# Patient Record
Sex: Female | Born: 1935 | Race: Black or African American | Hispanic: No | Smoking: Former smoker
Health system: Southern US, Community
[De-identification: ages and names within clinical notes are randomized; demographics above are authoritative.]

## PROBLEM LIST (undated history)

## (undated) DIAGNOSIS — I10 Essential (primary) hypertension: Secondary | ICD-10-CM

## (undated) DIAGNOSIS — R42 Dizziness and giddiness: Secondary | ICD-10-CM

## (undated) DIAGNOSIS — R011 Cardiac murmur, unspecified: Secondary | ICD-10-CM

## (undated) DIAGNOSIS — F0390 Unspecified dementia without behavioral disturbance: Secondary | ICD-10-CM

## (undated) DIAGNOSIS — W19XXXA Unspecified fall, initial encounter: Secondary | ICD-10-CM

## (undated) HISTORY — PX: LEG SURGERY: SHX1003

## (undated) HISTORY — PX: ABDOMINAL HYSTERECTOMY: SHX81

---

## 1999-07-02 ENCOUNTER — Emergency Department (HOSPITAL_COMMUNITY): Admission: EM | Admit: 1999-07-02 | Discharge: 1999-07-02 | Payer: Self-pay | Admitting: Emergency Medicine

## 2000-01-29 ENCOUNTER — Emergency Department (HOSPITAL_COMMUNITY): Admission: EM | Admit: 2000-01-29 | Discharge: 2000-01-29 | Payer: Self-pay | Admitting: Emergency Medicine

## 2002-05-19 ENCOUNTER — Encounter: Payer: Self-pay | Admitting: Emergency Medicine

## 2002-05-19 ENCOUNTER — Emergency Department (HOSPITAL_COMMUNITY): Admission: EM | Admit: 2002-05-19 | Discharge: 2002-05-19 | Payer: Self-pay | Admitting: Emergency Medicine

## 2003-08-12 ENCOUNTER — Encounter: Payer: Self-pay | Admitting: Nephrology

## 2003-08-12 ENCOUNTER — Inpatient Hospital Stay (HOSPITAL_COMMUNITY): Admission: EM | Admit: 2003-08-12 | Discharge: 2003-08-17 | Payer: Self-pay | Admitting: Emergency Medicine

## 2003-08-14 ENCOUNTER — Encounter (INDEPENDENT_AMBULATORY_CARE_PROVIDER_SITE_OTHER): Payer: Self-pay | Admitting: Specialist

## 2003-08-15 ENCOUNTER — Encounter (INDEPENDENT_AMBULATORY_CARE_PROVIDER_SITE_OTHER): Payer: Self-pay | Admitting: Cardiology

## 2003-08-16 ENCOUNTER — Encounter: Payer: Self-pay | Admitting: Internal Medicine

## 2004-07-04 ENCOUNTER — Inpatient Hospital Stay (HOSPITAL_COMMUNITY): Admission: EM | Admit: 2004-07-04 | Discharge: 2004-07-11 | Payer: Self-pay

## 2004-07-27 ENCOUNTER — Ambulatory Visit (HOSPITAL_COMMUNITY): Admission: RE | Admit: 2004-07-27 | Discharge: 2004-07-27 | Payer: Self-pay | Admitting: Internal Medicine

## 2004-09-06 ENCOUNTER — Emergency Department (HOSPITAL_COMMUNITY): Admission: EM | Admit: 2004-09-06 | Discharge: 2004-09-06 | Payer: Self-pay | Admitting: Emergency Medicine

## 2004-10-02 ENCOUNTER — Emergency Department (HOSPITAL_COMMUNITY): Admission: EM | Admit: 2004-10-02 | Discharge: 2004-10-02 | Payer: Self-pay | Admitting: Emergency Medicine

## 2004-11-06 ENCOUNTER — Ambulatory Visit (HOSPITAL_COMMUNITY): Admission: RE | Admit: 2004-11-06 | Discharge: 2004-11-06 | Payer: Self-pay | Admitting: Internal Medicine

## 2005-04-03 ENCOUNTER — Inpatient Hospital Stay (HOSPITAL_COMMUNITY): Admission: EM | Admit: 2005-04-03 | Discharge: 2005-04-06 | Payer: Self-pay | Admitting: Emergency Medicine

## 2005-04-18 ENCOUNTER — Ambulatory Visit: Payer: Self-pay | Admitting: Internal Medicine

## 2005-05-05 ENCOUNTER — Ambulatory Visit: Payer: Self-pay | Admitting: Internal Medicine

## 2005-05-26 ENCOUNTER — Ambulatory Visit: Payer: Self-pay | Admitting: Internal Medicine

## 2005-06-06 ENCOUNTER — Emergency Department (HOSPITAL_COMMUNITY): Admission: EM | Admit: 2005-06-06 | Discharge: 2005-06-06 | Payer: Self-pay | Admitting: Emergency Medicine

## 2005-06-09 ENCOUNTER — Emergency Department (HOSPITAL_COMMUNITY): Admission: EM | Admit: 2005-06-09 | Discharge: 2005-06-10 | Payer: Self-pay | Admitting: Emergency Medicine

## 2005-06-11 ENCOUNTER — Ambulatory Visit: Payer: Self-pay | Admitting: Internal Medicine

## 2005-07-03 ENCOUNTER — Ambulatory Visit: Payer: Self-pay | Admitting: Internal Medicine

## 2005-07-18 ENCOUNTER — Ambulatory Visit: Payer: Self-pay | Admitting: Internal Medicine

## 2005-09-02 ENCOUNTER — Ambulatory Visit: Payer: Self-pay | Admitting: Internal Medicine

## 2005-09-22 ENCOUNTER — Ambulatory Visit: Payer: Self-pay | Admitting: Internal Medicine

## 2005-10-09 ENCOUNTER — Ambulatory Visit: Payer: Self-pay | Admitting: Internal Medicine

## 2005-10-24 ENCOUNTER — Ambulatory Visit: Payer: Self-pay | Admitting: Internal Medicine

## 2005-11-05 ENCOUNTER — Emergency Department (HOSPITAL_COMMUNITY): Admission: EM | Admit: 2005-11-05 | Discharge: 2005-11-05 | Payer: Self-pay | Admitting: Emergency Medicine

## 2005-11-25 ENCOUNTER — Ambulatory Visit: Payer: Self-pay | Admitting: Internal Medicine

## 2006-03-10 ENCOUNTER — Ambulatory Visit (HOSPITAL_COMMUNITY): Admission: RE | Admit: 2006-03-10 | Discharge: 2006-03-10 | Payer: Self-pay | Admitting: Internal Medicine

## 2006-04-25 ENCOUNTER — Inpatient Hospital Stay (HOSPITAL_COMMUNITY): Admission: EM | Admit: 2006-04-25 | Discharge: 2006-04-28 | Payer: Self-pay | Admitting: Emergency Medicine

## 2006-04-27 ENCOUNTER — Encounter (INDEPENDENT_AMBULATORY_CARE_PROVIDER_SITE_OTHER): Payer: Self-pay | Admitting: Interventional Cardiology

## 2009-01-16 ENCOUNTER — Other Ambulatory Visit: Admission: RE | Admit: 2009-01-16 | Discharge: 2009-01-16 | Payer: Self-pay | Admitting: Internal Medicine

## 2009-09-11 ENCOUNTER — Emergency Department (HOSPITAL_COMMUNITY): Admission: EM | Admit: 2009-09-11 | Discharge: 2009-09-11 | Payer: Self-pay | Admitting: Emergency Medicine

## 2011-02-27 LAB — DIFFERENTIAL
Eosinophils Absolute: 0.3 10*3/uL (ref 0.0–0.7)
Monocytes Absolute: 0.6 10*3/uL (ref 0.1–1.0)

## 2011-02-27 LAB — POCT I-STAT, CHEM 8
Creatinine, Ser: 1.4 mg/dL — ABNORMAL HIGH (ref 0.4–1.2)
HCT: 40 % (ref 36.0–46.0)
TCO2: 23 mmol/L (ref 0–100)

## 2011-02-27 LAB — POCT CARDIAC MARKERS
Myoglobin, poc: 165 ng/mL (ref 12–200)
Troponin i, poc: <0.05 ng/mL (ref 0.00–0.09)

## 2011-02-27 LAB — URINALYSIS, ROUTINE W REFLEX MICROSCOPIC
Glucose, UA: NEGATIVE mg/dL
Hgb urine dipstick: NEGATIVE
Ketones, ur: NEGATIVE mg/dL
Leukocytes, UA: NEGATIVE
Nitrite: NEGATIVE
Protein, ur: 100 mg/dL — AB
pH: 7 (ref 5.0–8.0)

## 2011-02-27 LAB — URINE MICROSCOPIC-ADD ON

## 2011-02-27 LAB — CBC
HCT: 38.7 % (ref 36.0–46.0)
Hemoglobin: 13 g/dL (ref 12.0–15.0)
MCHC: 33.7 g/dL (ref 30.0–36.0)
MCV: 83.5 fL (ref 78.0–100.0)

## 2011-04-11 NOTE — Op Note (Signed)
[name], [name]                          ACCOUNT NO.:  [id number]   MEDICAL RECORD NO.:  [id number]                   PATIENT TYPE:  INP   LOCATION:  5148                                 FACILITY:  [facility]   PHYSICIAN:  Anselmo Rod, M.D.               DATE OF BIRTH:  [birth year]   DATE OF PROCEDURE:  08/12/2003  DATE OF DISCHARGE:                                 OPERATIVE REPORT   PROCEDURE PERFORMED:  Colonoscopy.   ENDOSCOPIST:  Charna Elizabeth, M.D.   INSTRUMENT USED:  Olympus video colonoscope.   INDICATIONS FOR PROCEDURE:  Rectal bleeding in an 77 year old African-  American female.  Rule out colonic polyps, masses, etc.   PREPROCEDURE PREPARATION:  Informed consent was procured from the patient.  The patient was fasted for eight hours prior to the procedure and prepped  with a bottle of magnesium citrate and a gallon of GoLYTELY the night prior  to the procedure.   PREPROCEDURE PHYSICAL:  The patient had stable vital signs.  Neck supple.  Chest clear to auscultation.  S1 and S2 regular.  Abdomen soft with normal  bowel sounds.   DESCRIPTION OF PROCEDURE:  The patient was placed in left lateral decubitus  position and sedated with 70 mcg of fentanyl and 6 mg of Versed  intravenously.  Once the patient was adequately sedated and maintained on  low flow oxygen and continuous cardiac monitoring, the Olympus video  colonoscope was advanced from the rectum to the cecum with difficulty.  There was evidence of pandiverticulosis with more prominent changes in the  left colon.  Fresh clots were seen in a couple of the diverticula which may  indicate the source of the patient's bleeding.  A small mucosal lipoma was  seen in the right colon.  This was not biopsied.  No attempts were made to  remove it.  Lipomatous ileocecal valve was also identified.  The  appendicular orifice and ileocecal valve were visualized and photographed.  No masses or polyps were seen.  The patient  tolerated the procedure well  without complications.   IMPRESSION:  1. Pandiverticulosis with more prominent changes in the sigmoid colon.  2. A small submucosal lipoma in the right colon.  3. Lipomatous ileocecal valve.  4. Blood clots in a couple of the diverticula, question diverticular bleed   RECOMMENDATIONS:  1. Avoid nuts, seeds and popcorn.  2. Continue serial CBCs.  3. Outpatient follow-up in the next two weeks or earlier if need be.  4. Iron supplementation with close monitoring of hemoglobin level.                                                   Anselmo Rod, M.D.    JNM/MEDQ  D:  08/16/2003  T:  08/17/2003  Job:  [id number]   cc:   Margaretmary Bayley, M.D.  [address], [address]  [place]  [place] [address]  Fax: [phone]   Cristy Hilts. Jacinto Halim, M.D.  [address]  [place]  [place] [address]  Fax: [phone]
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
## 2011-04-11 NOTE — Consult Note (Signed)
NAMEBENTLI, LLORENTE                          ACCOUNT NO.:  [id number]   MEDICAL RECORD NO.:  [id number]                   PATIENT TYPE:  INP   LOCATION:  5148                                 FACILITY:  [facility]   PHYSICIAN:  Anselmo Rod, M.D.               DATE OF BIRTH:  [birth year]   DATE OF CONSULTATION:  08/13/2003  DATE OF DISCHARGE:                                   CONSULTATION   REASON FOR CONSULTATION:  Rectal bleeding.   ASSESSMENT:  1. Hematochezia for the last 24 hours.  Rule out colonic polyps, masses,     diverticulosis as upper gastrointestinal source of bleeding, peptic ulcer     disease, arteriovenous malformations, etc.  2. History of a hiatal hernia/gastroesophageal reflux disease on Protonix.  3. Chronic constipation on stool softeners p.r.n.  4. Adult-onset diabetes mellitus on Amaryl.  Hemoglobin A1C during this     hospitalization is 8.4.  5. Arthritis on Mobic.  Has been on Vioxx and Celebrex in the past.  6. Recently diagnosed sciatica on prednisone taper which the patient did not     take.  7. Status post two NVD's in the remote past.  8. History of abdominal hysterectomy 1960.  9. Urinary frequency, question stress incontinence on Ditropan in the past.  10.      Shortness of breath.  Rule out chronic congestive heart failure.   RECOMMENDATIONS:  1. Agree with PPI's.  2. Discontinue all nonsteroidals including aspirin and Mobic.  3. Prep with Go-LYTE LY for EGD tomorrow.  4. Continue serial CBCs.  5. Transfuse as needed to keep hematocrit about 25-28%.  6. Check a TSH with next blood draw.  7. 2-D echocardiogram in a.m. (discuss with Vonna Kotyk R. Jacinto Halim, M.D.   DISCUSSION:  Ms. [name] is a pleasant 77 year old African-American  female admitted to [facility] on August 12, 2003 for further  evaluation of hematochezia.  The patient describes three bloody bowel  movements at home after 2 p.m.  Initially, she felt dizzy, tired, and  fatigued and about 2:00 started passing bright red blood per rectum.  This  was associated with some weakness but the patient denies syncope or near  syncopal episodes.  She gives a similar history three weeks ago, but claims  no work-up was undertaken.  There is no history of nausea, vomiting,  hematemesis, or coffee ground emesis associated with her symptoms.  She  denies any history of melena.  She has had some lower abdominal cramping but  there is no history of fever, chills, or rigors.  There is no previous  history of ulcer, jaundice, or colitis.  She has had back pain radiating  down to her thighs and legs and has been diagnosed with sciatica in the  past.  She was put on a prednisone taper which she did not take.  She  describes progressive shortness of breath for  the last one year, but has not  had this worked up (according to the patient).  There are no known  cardiorespiratory problems.  She denies chest pain or diaphoresis.  There is  history of abnormal weight loss or weight gain.  She has a history of  chronic constipation and takes stool softeners as needed.  She denies having  undergone a colonoscopy in the past.  She has a history of headaches, but  denies any ENT or renal complaints.   ALLERGIES:  NO KNOWN DRUG ALLERGIES.   MEDICATIONS:  1. Amaryl.  2. Mobic.  3. Protonix.  4. Aspirin 81 mg daily.  5. Prednisone taper (patient did not take this).   PAST MEDICAL HISTORY:  See list above.   SOCIAL HISTORY:  She is a widow.  Her husband died in 31 of an MI.  She  has two grown children who reside in [place], [place] and [place],  respectively.  She has four grandchildren, several great grandchildren.  She  used to smoke a half pack per day from the age of 62 to the age of 67, but  has not smoked since then.  She used to drink socially in her younger years,  but has not had any alcohol since the age of 35.  She denies use of illicit  street drugs.   FAMILY  HISTORY:  The patient could not tell me any of the details on her  family.   PHYSICAL EXAMINATION:  GENERAL:  Very pleasant, articulate, elderly African-  American female in no acute distress.  VITAL SIGNS:  Blood pressure 147/68, pulse 70 per minute, respiratory rate  20, temperature 97.5.  HEENT:  [place] AT.  PERRLA.  The patient has pink and moist oropharyngeal mucosa  without exudate.  Dentures in the upper jaw and partials in the lower jaw.  NECK:  Supple.  No JVD, thyromegaly, lymphadenopathy.  CHEST:  Clear to auscultation.  HEART:  S1, S2 regular with decreased breath sounds at the bases.  ABDOMEN:  Soft, obese with large surgical scar present in the midline below  the umbilicus from previous abdominal hysterectomy.  There is some left  lower quadrant, right lower quadrant tenderness on palpation with guarding.  No rebound.  No rigidity.  No hepatosplenomegaly.  No masses palpable.  RECTAL:  Digital rectal examination done by Dr. Sherrie Mustache revealed bright red  blood on examining finger with no masses palpable.  Rectal examination was  not repeated by me today.   LABORATORIES:  Hemoglobin 11.2, white count 13.8, platelets 308,000.  PT  13.6, PTT 35, INR 1.1.  Repeat hemoglobin today was 11.3, white count 12.9,  platelets 267,000.   Chest x-ray showed decreased lung volumes with cardiomegaly.   EKG showed normal sinus rhythm with nonspecific T-wave abnormality.   PLAN:  As outlined above.  Further recommendations made in follow-up.                                               Anselmo Rod, M.D.    JNM/MEDQ  D:  08/13/2003  T:  08/14/2003  Job:  [id number]   cc:   Jarome Matin, M.D.  [address] [place]  [place] [address]  Fax: [phone]   Cristy Hilts. Jacinto Halim, M.D.  [address]  [place]  [place] [address]  Fax:  [phone]   Margaretmary Bayley, M.D.  [address]  [place]  [place] [address] Fax: [phone]
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
## 2011-04-11 NOTE — Op Note (Signed)
   NAMEPEIGHTON, [name]                          ACCOUNT NO.:  [id number]   MEDICAL RECORD NO.:  [id number]                   PATIENT TYPE:  INP   LOCATION:  5148                                 FACILITY:  [facility]   PHYSICIAN:  Anselmo Rod, M.D.               DATE OF BIRTH:  04-Sep-1917   DATE OF PROCEDURE:  08/14/2003  DATE OF DISCHARGE:                                 OPERATIVE REPORT   PROCEDURE PERFORMED:  Colonoscopy changed to flexible sigmoidoscopy.   ENDOSCOPIST:  Charna Elizabeth, M.D.   INSTRUMENT USED:  Olympus video colonoscope.   INDICATIONS FOR PROCEDURE:  The patient is an 77 year old African-American  female with a history of rectal bleeding and melenic stool with anemia.  Rule out colonic polyps, masses, etc.   PREPROCEDURE PREPARATION:  Informed consent was procured from the patient.  The patient was fasted for eight hours prior to the procedure and prepped  with a gallon of GoLYTELY.  The patient, however, did not consume the whole  gallon of GoLYTELY prior to the procedure.   PREPROCEDURE PHYSICAL:  The patient had stable vital signs.  Neck supple.  Chest clear to auscultation.  S1 and S2 regular.  Abdomen soft with normal  bowel sounds.   DESCRIPTION OF PROCEDURE:  The patient was placed in left lateral decubitus  position and sedated with Demerol and Versed for the EGD.  Once the patient  was adequately sedated and maintained on low flow oxygen and continuous  cardiac monitoring, the Olympus video colonoscope was advanced from the  rectum to about 10 cm.  There was dark melenic stool present in the rectum  up to 10 cm and beyond that there was brown stool.  The scope could not be  advanced beyond this point and the procedure was aborted with plans to  reprep the patient today and do a colonoscopy tomorrow.   IMPRESSION:  Inadequate prep.  Colonoscopy planned in the a.m. after  reprepping the patient tonight.               Anselmo Rod, M.D.    JNM/MEDQ  D:  08/14/2003  T:  08/14/2003  Job:  [id number]   cc:   Margaretmary Bayley, M.D.  [address]  [place]  [place] [address]  Fax: [phone]   Cristy Hilts. Jacinto Halim, M.D.  [address]  [place]  [place] [address]  Fax: [phone]

## 2011-04-11 NOTE — Discharge Summary (Signed)
[name], [name] NO.:  [id number]   MEDICAL RECORD NO.:  [id number]                   PATIENT TYPE:  INP   LOCATION:  5148                                 FACILITY[facility]   PHYSICIAN:  Margaretmary Bayley, M.D.                 DATE OF BIRTH:  [birth year]   DATE OF ADMISSION:  08/12/2003  DATE OF DISCHARGE:  08/17/2003                                 DISCHARGE SUMMARY   DISCHARGE DIAGNOSES:  1. Diverticulosis of the colon with hemorrhage.  2. Insulin-independent diabetes mellitus.  3. Osteoarthritis.  4. Left lumbar radiculopathy with evidence of lumbar disk displacement.  5. Helicobacter pylori gastroesophagitis.  6. Systemic hypertension.   REASON FOR ADMISSION:  This is one of several [place] hospitalizations  for [name], an 77 year old hypertensive type 2 diabetic who is brought  into the emergency department with a chief complaint of rectal bleeding for  several hours.  The patient denies any associated abdominal pain.  She did  feel lightheaded and dizziness on standing but denies any syncopal episodes.  She denies any previous history of rectal bleeding but has noted in the  past, symptoms of reflux gastroesophagitis and peptic ulcer disease.   PERTINENT PHYSICAL FINDINGS:  VITAL SIGNS:  On admission, she had a blood  pressure of 134/68 in the sitting position with the legs dangling and a  pulse rate of 84, respiratory rate of 20 and temperature was 97.  HEENT:  There is no conjunctival pallor, no scleral icterus.  She had an NG  tube in place.  NECK:  Her neck is supple.  No jugular venous distention, no carotid bruits  and no thyromegaly.  CHEST:  No splinting or tenderness.  Lungs are clear to percussion and  auscultation.  CARDIAC:  Her precordium is 2+ hyperdynamic.  S1 and S2 are normal.  No  discernable murmurs, rubs, or gallops.  ABDOMEN:  Her abdomen is obese with slight epigastric and periumbilical  tenderness, no rebound  tenderness.  She had active bowel sounds.  No  palpable organomegaly and no masses are palpated.  RECTAL:  She had good sphincter tone.  Stool was positive for blood  grossly, being dark maroonish in color.  There were no palpable masses or  tenderness appreciated.   ADMISSION LABORATORY DATA:  White count of 13,800, hematocrit of 33,  hemoglobin of 11.2.  Her INR and PTT were normal.  Her CMET was normal  except for a glucose of 116 and albumin of 3.3.  A hemoglobin A1c is 8.4%.   Chest x-ray revealed bilateral cervical spurring.  She had mild cardiomegaly  and increase in interstitial markings at the bases.  Her abdominal film  revealed no acute changes.  She had a normal gas pattern.   HOSPITAL COURSE:  The patient was admitted and initially underwent an upper  endoscopy because of her upper intestinal tenderness and symptomatology.  Upper endoscopy revealed diffuse gastritis which subsequently did return  positive for H. pylori.  She was started on therapy with Prevpac, a  combination of Prevacid along with dual-antibiotic therapy for H. pylori.  A  subsequent colonoscopy performed two days after endoscopy revealed fairly  significant diverticular changes in the colon, however, no mass lesions or  polyps were seen.  The patient's diet was gradually advanced over the next  24 hours.  Her diabetes was controlled with a combination of oral  hyperglycemic agents and a fractional schedule of Regular short-acting  insulin for her preprandial blood sugars.   During her hospitalization, the patient continued to complain of lower back  pain radiating into the left hip area and down the leg.  An MRI performed on  this admission did reveal an L5 disk herniation with compression.  She was  scheduled to be started on epidural steroid injections as an outpatient.  The patient was subsequently discharged in a condition that was  significantly improved.   DIET:  Diet is one of a 3-g sodium,  1500-calorie, carb-modified, antireflux-  exclusion diet.   FOLLOWUP:  She is scheduled to be seen back in the office in two weeks, at  which time she will be formally referred for initiation of her epidural  steroids.                                                Margaretmary Bayley, M.D.    PC/MEDQ  D:  09/28/2003  T:  09/29/2003  Job:  [id number]

## 2011-04-11 NOTE — Discharge Summary (Signed)
[name], [name] NO.:  [id number]   MEDICAL RECORD NO.:  [id number]          PATIENT TYPE:  INP   LOCATION:  3707                         FACILITY:  [facility]   PHYSICIAN:  Margaretmary Bayley, M.D.    DATE OF BIRTH:  [birth year]   DATE OF ADMISSION:  04/25/2006  DATE OF DISCHARGE:  04/28/2006                                 DISCHARGE SUMMARY   DATE OF ADMISSION:  April 25, 2006   DATE OF DISCHARGE:  April 28, 2006   DISCHARGE DIAGNOSES:  1.  Chest pain with pulmonary embolization and acute myocardial infarction      being excluded.  2.  Chronic obstructive and interstitial lung disease.  3.  History of chronic pyelonephritis and cystitis.  4.  Type 2 non-insulin dependent diabetes mellitus.  5.  Systemic hypertension.   REASON FOR ADMISSION:  This is one of several [place] hospitalizations  for [name], an 77 year old hypertensive, type 2 diabetic woman, who is  brought into the emergency room with a chief complaint of anterior chest  pain, some mild shortness of breath, but no nausea, vomiting, or  diaphoresis.   PERTINENT PHYSICAL FINDINGS:  GENERAL:  On exam she is an elderly woman  looking younger than her stated age of 22.  She has no respiratory distress.  Skin is cool and dry.  VITAL SIGNS:  Temperature 98.5, pulse rate of 66, blood pressure 128/55, and  a respiratory rate of 20.  HEENT:  She has some mild erythema of the right, greater than left, eye  secondary to recent cataract surgery.  Her precordium was adynamic.  No  visible pulsations.  No heaves or thrills.  LUNGS:  Clear to percussion and auscultation.  No wheezes are noted.  CHEST:  She had some mild tenderness of the mid chest wall and epigastrium,  but no rebound or other signs of peritonitis.  EXTREMITIES:  No clubbing, cyanosis, or edema.  Negative Homans sign.  NEUROLOGICAL:  She is alert, oriented, and cooperative.   PERTINENT LAB AND X-RAY DATA:  Admission white count is 13,900,  hematocrit  of 37.  Her pH is 97.45 with a pCO2 of 36.  Her hematocrit is 37 with a  hemoglobin of 12.4.  Nonfasting glucose of 121.  D-dimer of 1.0.  Her PT and  INR are normal, as well as a PTT of 25.  The remainder of her CMED is  perfectly normal.  Her CK level is normal at 127 with an MB of 3.2 and a  relative index of 2.5, troponin of 0.02, all of which are normal.  TSH of  2.8 and T3 of 104.  These are likewise normal.  The admission EKG revealed a  normal sinus rhythm with a first degree AV block, nonspecific repolarization  abnormalities diffusely, but she had no hyperacute T wave abnormalities and  no deeply inverted T waves.  Subsequent EKGs on her second and third  hospital days, likewise, showed no evolutionary changes and no other acute  changes.  A CT angiography of the chest showed no evidence of pulmonary  emboli.  She had some cardiomegaly with chronic bronchitic changes and  chronic interstitial lung disease.  She had a small sliding hiatal hernia.   HOSPITAL COURSE:  The patient was admitted and treated with a working  diagnosis of an acute myocardial infarction receiving telemetry monitoring,  prophylactic Lovenox therapy, and symptomatic treatment of her chest pain,  which lasted only several hours into her hospitalization.  As noted above,  the patient's parameters, which included her CK levels, troponin, serial  EKGs, were not consistent with acute cardiopulmonary event.  She did have a  sliding hiatal hernia and in the past has had clinical symptomatologies  consistent with gastroesophageal reflux disease.  During the  hospitalization, several other tests were requested, namely a BNP, but the  patient became very negative and refused to cooperate for any other studies.  On the fourth hospital day, she refused to wait for my evaluation and to  discuss our findings and subsequent disposition.  She signed out AMA, but  was in a stable condition and was asymptomatic at  the time of her release.   The patient will be contacted the day after leaving the hospital.  She is  instructed to resume her outpatient medications, which included Diovan 160 a  day, Toprol-XL 25 mg per day, Ecotrin 81 mg daily, Lasix 20 mg per day,  GlycoLax, and her ophthalmologic eye drops.  Diet is a 3 g sodium modified  fat-restricted, no concentrated sweets diet.           ______________________________  Margaretmary Bayley, M.D.     PC/MEDQ  D:  06/04/2006  T:  06/05/2006  Job:  [id number]

## 2011-04-11 NOTE — Op Note (Signed)
[name], [name]                          ACCOUNT NO.:  [id number]   MEDICAL RECORD NO.:  [id number]                   PATIENT TYPE:  INP   LOCATION:  5148                                 FACILITY:  [facility]   PHYSICIAN:  Anselmo Rod, M.D.               DATE OF BIRTH:  08/18/1917   DATE OF PROCEDURE:  08/14/2003  DATE OF DISCHARGE:                                 OPERATIVE REPORT   PROCEDURE PERFORMED:  Esophagogastroduodenoscopy with biopsies x3.   ENDOSCOPIST:  Anselmo Rod, M.D.   INSTRUMENT USED:  Olympus video panendoscope.   INDICATION FOR PROCEDURE:  An 77 year old African-American female with a  history of anemia and rectal bleeding followed by dark, tarry stools.  Rule  out peptic ulcer disease, esophagitis, gastritis, etc.   PREPROCEDURE PREPARATION:  Informed consent was procured from the patient.  The patient was fasted for eight hours prior to the procedure.   PREPROCEDURE PHYSICAL:  VITAL SIGNS:  The patient had stable vital signs.  NECK:  Supple.  CHEST:  Clear to auscultation.  S1, S2 regular.  ABDOMEN:  Soft with normal bowel sounds.   DESCRIPTION OF PROCEDURE:  The patient was placed in the left lateral  decubitus position and sedated with 50 mg of Demerol and 5 mg of Versed  intravenously.  Once the patient was adequately sedate and maintained on low-  flow oxygen and continuous cardiac monitoring, the Olympus video  panendoscope was advanced through the mouthpiece, over the tongue, into the  esophagus under direct vision.  The entire esophagus appeared normal with no  evidence of ring, stricture, masses, esophagitis, or Barrett's mucosa.  The  scope was then advanced to the stomach.  There was a small hiatal hernia  seen on retroflexion.  There was diffuse gastritis noted throughout the  gastric mucosa.  The proximal small bowel appeared normal.   IMPRESSION:  1. Normal-appearing esophagus and proximal small bowel.  2. Severe diffuse gastritis,  biopsy done for Helicobacter pylori by     pathology.  3. Small hiatal hernia seen on retroflexion in the high cardia.   RECOMMENDATIONS:  1. Change Protonix to p.o.  2.     Proceed with a colonoscopy at this time.  3. Discontinue all nonsteroidals, including aspirin.  4. Treat with antibiotics if H. pylori present by pathology.                                               Anselmo Rod, M.D.    JNM/MEDQ  D:  08/14/2003  T:  08/14/2003  Job:  [id number]   cc:   Margaretmary Bayley, M.D.  [address]  [place]  [place] [address]  Fax: [phone]   Cristy Hilts. Jacinto Halim, M.D.  [address] [place]  [place]  [place] [address]  Fax: [phone]

## 2011-04-11 NOTE — Op Note (Signed)
[name], [name]                          ACCOUNT NO.:  [id number]   MEDICAL RECORD NO.:  [id number]                   PATIENT TYPE:  INP   LOCATION:  2107                                 FACILITY:  [facility]   PHYSICIAN:  Mark C. Ophelia Charter, M.D.                 DATE OF BIRTH:  [birth year]   DATE OF PROCEDURE:  DATE OF DISCHARGE:                                 OPERATIVE REPORT   PREOPERATIVE DIAGNOSIS:  Right intertrochanteric hip fracture.   POSTOPERATIVE DIAGNOSIS:  Right intertrochanteric hip fracture.   PROCEDURE:  Right hip compression screw, Synthes 85-mm lag, 135 4-hole side  plate.   SURGEON:  Mark C. Ophelia Charter, MD.   ANESTHESIA:  GOT.   ESTIMATED BLOOD LOSS:  200 mL.   OPERATIVE PROCEDURE:  After induction of general anesthesia and prep with  Ancef prophylaxis, patient placed on the fracture table, a well-leg holder  for the left leg.  Traction was applied, internal rotation for  lateralization of the trochanter, and reduction was performed and checked  under fluoroscopy, with anatomic position.  Standard prepping and draping, a  large shower curtain binder was brought after area was squared with towels.  An incision was made starting at the trochanter and extending laterally in  line with the femur.  The subcutaneous tissue, which is about 10 to 14 cm of  adipose tissue, was divided until the extensor fascia was identified.  It  was nicked, split in line with the fibers using scissors.  The vastus  lateralis both anteriorly, subperiosteal dissection of the lateral cortex of  the femur.  __________ were used to drill holes starting in the lateral  cortex of the femur, checked under fluoro, then was tapped up center-center,  drilled by hand, tapped, and then measurement made, and an 85 screw placed.  Portal side plate was then applied at 135 degrees, holes were drilled,  screws were inserted, varying between 34 and 38 mm.  The compression screw  was inserted, then removed  after being tightened down.  The traction was  released as this was tightened.  There was excellent position.  Irrigation  with saline solution.  The extensor fascia was closed with 0 Vicryl, 2-0  Vicryl was used in the subcutaneous tissue, skin staple closure, Marcaine  infiltration in the skin, postop dressing.  The patient was transferred to a  regular table and then to the recovery room.                                               Mark C. Ophelia Charter, M.D.    MCY/MEDQ  D:  07/05/2004  T:  07/07/2004  Job:  [id number]

## 2011-04-11 NOTE — Discharge Summary (Signed)
[name], [name]              ACCOUNT NO.:  [id number]   MEDICAL RECORD NO.:  [id number]          PATIENT TYPE:  INP   LOCATION:  0445                         FACILITY:  [facility]   PHYSICIAN:  Margaretmary Bayley, M.D.    DATE OF BIRTH:  Apr 20, 1917   DATE OF ADMISSION:  04/03/2005  DATE OF DISCHARGE:  04/06/2005                                 DISCHARGE SUMMARY   DISCHARGE DIAGNOSES:  1.  Acute pyelonephritis with Escherichia coli being grown from the urine.  2.  Systemic hypertension.  3.  History of type 2 diabetes mellitus.   REASON FOR ADMISSION:  [name] is an 77 year old hypertensive, diabetic  who was admitted with nausea, vomiting, and lower and periumbilical  abdominal pain.  The patient states that the pain occurred after she had  taken some Darvocet and she was of the opinion that it was probably the  medicine that caused her to feel nauseated but could not explain the  abdominal pain.  In any event, she came to the emergency room where she was  noted to have an elevated white blood count and was admitted for further  evaluation.   PERTINENT PHYSICAL FINDINGS:  GENERAL:  She is a well-developed, well-  nourished, African-American woman appearing younger than her stated age of  26.  VITAL SIGNS:  Blood pressure is 172/92, pulse rate of 96, respiratory rate  of 18-20 and her temperature was 99.  HEENT:  Her pharynx was without any injection or erythema.  NECK:  Is supple.  No adenopathy or thyromegaly.  CHEST:  No splinting, tenderness.  Her lungs were clear.  CARDIAC:  She had a regular rhythm.  Normal S1 and S2.  ABDOMEN:  She had some minimal suprapubic and periumbilical tenderness.  There was no rebound.  Bowel sounds were normal.  RECTAL:  The stool was negative for occult blood.   PERTINENT LABORATORY AND X-RAY DATA:  She had an elevated sedimentation rate  at 40.  Abnormalities in her metabolic panel.  There was an albumin that was  low at 2.9 and a random  glucose was elevated at 110.  A CPK, MB, troponin  were normal.  A urinalysis revealed many bacteria and 3-6 white cells per  high power field.  A three-way abdomen was remarkable for some cardiomegaly  but no acute infiltrates.  She had no air fluid levels and no evidence of  intestinal obstruction.  Her white blood count was elevated at 15,000.   HOSPITAL COURSE:  The patient was admitted with abdominal pain,  leukocytosis, and bacteruria consistent with pyelonephritis and not cystitis  although she did have extension of her infection into the lower urinary  tract.  She was hypertensive with a systolic blood pressure of 172.  After  blood and urine cultures were obtained, the patient was empirically started  on Unasyn at 1.5 g IV every eight hours.  A urine, renal, and abdominal  ultrasound was obtained and it was negative for any obstruction in the  urinary tract and no gallstones.  On the following morning, the patient's  white count was down  to 11,000.  At no point during her hospitalization did  she have a fever.  Her urine did grow out E coli sensitive to ampicillin.  With the patient's fairly unremarkable hospital course, she was subsequently  discharged home on Augmentin 875 mg twice a day, Ecotrin 81 mg daily, and  for her blood pressure, Avalide 150/12.5 mg once a day.   DIET:  A no added salt.   She was encouraged to continue her calcium and vitamin B supplements and she  is to be seen back in my office in two weeks.      PC/MEDQ  D:  04/10/2005  T:  04/10/2005  Job:  [id number]

## 2012-02-16 ENCOUNTER — Encounter: Payer: Self-pay | Admitting: Physician Assistant

## 2012-04-20 ENCOUNTER — Encounter (HOSPITAL_COMMUNITY): Payer: Self-pay

## 2012-04-20 ENCOUNTER — Inpatient Hospital Stay (HOSPITAL_COMMUNITY)
Admission: EM | Admit: 2012-04-20 | Discharge: 2012-04-24 | DRG: 149 | Disposition: A | Discharge status: Home / Self Care | Payer: MEDICARE | Attending: Internal Medicine | Admitting: Internal Medicine

## 2012-04-20 DIAGNOSIS — Z9119 Patient's noncompliance with other medical treatment and regimen: Secondary | ICD-10-CM

## 2012-04-20 DIAGNOSIS — H8110 Benign paroxysmal vertigo, unspecified ear: Principal | ICD-10-CM | POA: Diagnosis present

## 2012-04-20 DIAGNOSIS — S0100XA Unspecified open wound of scalp, initial encounter: Secondary | ICD-10-CM | POA: Diagnosis present

## 2012-04-20 DIAGNOSIS — E049 Nontoxic goiter, unspecified: Secondary | ICD-10-CM | POA: Diagnosis present

## 2012-04-20 DIAGNOSIS — R42 Dizziness and giddiness: Secondary | ICD-10-CM

## 2012-04-20 DIAGNOSIS — I6529 Occlusion and stenosis of unspecified carotid artery: Secondary | ICD-10-CM | POA: Diagnosis present

## 2012-04-20 DIAGNOSIS — I658 Occlusion and stenosis of other precerebral arteries: Secondary | ICD-10-CM | POA: Diagnosis present

## 2012-04-20 DIAGNOSIS — Z91199 Patient's noncompliance with other medical treatment and regimen due to unspecified reason: Secondary | ICD-10-CM

## 2012-04-20 DIAGNOSIS — I10 Essential (primary) hypertension: Secondary | ICD-10-CM | POA: Diagnosis present

## 2012-04-20 DIAGNOSIS — N289 Disorder of kidney and ureter, unspecified: Secondary | ICD-10-CM | POA: Diagnosis present

## 2012-04-20 DIAGNOSIS — Z87891 Personal history of nicotine dependence: Secondary | ICD-10-CM

## 2012-04-20 DIAGNOSIS — S60229A Contusion of unspecified hand, initial encounter: Secondary | ICD-10-CM | POA: Diagnosis present

## 2012-04-20 DIAGNOSIS — E119 Type 2 diabetes mellitus without complications: Secondary | ICD-10-CM | POA: Diagnosis present

## 2012-04-20 DIAGNOSIS — I6523 Occlusion and stenosis of bilateral carotid arteries: Secondary | ICD-10-CM | POA: Diagnosis present

## 2012-04-20 DIAGNOSIS — I129 Hypertensive chronic kidney disease with stage 1 through stage 4 chronic kidney disease, or unspecified chronic kidney disease: Secondary | ICD-10-CM | POA: Diagnosis present

## 2012-04-20 DIAGNOSIS — W19XXXA Unspecified fall, initial encounter: Secondary | ICD-10-CM | POA: Diagnosis present

## 2012-04-20 DIAGNOSIS — N183 Chronic kidney disease, stage 3 unspecified: Secondary | ICD-10-CM | POA: Diagnosis present

## 2012-04-20 DIAGNOSIS — S0003XA Contusion of scalp, initial encounter: Secondary | ICD-10-CM

## 2012-04-20 HISTORY — DX: Essential (primary) hypertension: I10

## 2012-04-20 HISTORY — DX: Cardiac murmur, unspecified: R01.1

## 2012-04-21 ENCOUNTER — Emergency Department (HOSPITAL_COMMUNITY): Payer: MEDICARE

## 2012-04-21 ENCOUNTER — Observation Stay (HOSPITAL_COMMUNITY): Payer: MEDICARE

## 2012-04-21 ENCOUNTER — Encounter (HOSPITAL_COMMUNITY): Payer: Self-pay | Admitting: Radiology

## 2012-04-21 DIAGNOSIS — R42 Dizziness and giddiness: Secondary | ICD-10-CM

## 2012-04-21 DIAGNOSIS — N289 Disorder of kidney and ureter, unspecified: Secondary | ICD-10-CM

## 2012-04-21 DIAGNOSIS — W19XXXA Unspecified fall, initial encounter: Secondary | ICD-10-CM

## 2012-04-21 DIAGNOSIS — N189 Chronic kidney disease, unspecified: Secondary | ICD-10-CM

## 2012-04-21 DIAGNOSIS — S0003XA Contusion of scalp, initial encounter: Secondary | ICD-10-CM

## 2012-04-21 DIAGNOSIS — I10 Essential (primary) hypertension: Secondary | ICD-10-CM

## 2012-04-21 LAB — CBC
Hemoglobin: 12.1 g/dL (ref 12.0–15.0)
MCH: 27.9 pg (ref 26.0–34.0)
MCV: 80.4 fL (ref 78.0–100.0)
Platelets: 307 10*3/uL (ref 150–400)
RBC: 4.33 MIL/uL (ref 3.87–5.11)
WBC: 11.9 10*3/uL — ABNORMAL HIGH (ref 4.0–10.5)

## 2012-04-21 LAB — RAPID URINE DRUG SCREEN, HOSP PERFORMED
Barbiturates: NOT DETECTED
Tetrahydrocannabinol: NOT DETECTED

## 2012-04-21 LAB — POCT I-STAT TROPONIN I

## 2012-04-21 LAB — BASIC METABOLIC PANEL
BUN: 20 mg/dL (ref 6–23)
Calcium: 8.9 mg/dL (ref 8.4–10.5)
GFR calc non Af Amer: 25 mL/min — ABNORMAL LOW (ref >90)
Potassium: 4.2 mEq/L (ref 3.5–5.1)

## 2012-04-21 LAB — DIFFERENTIAL
Basophils Relative: 0 % (ref 0–1)
Eosinophils Absolute: 0.3 10*3/uL (ref 0.0–0.7)
Eosinophils Relative: 2 % (ref 0–5)
Lymphocytes Relative: 25 % (ref 12–46)
Lymphs Abs: 3 10*3/uL (ref 0.7–4.0)
Monocytes Absolute: 0.7 10*3/uL (ref 0.1–1.0)
Neutro Abs: 7.8 10*3/uL — ABNORMAL HIGH (ref 1.7–7.7)
Neutrophils Relative %: 66 % (ref 43–77)

## 2012-04-21 LAB — GLUCOSE, CAPILLARY
Glucose-Capillary: 101 mg/dL — ABNORMAL HIGH (ref 70–99)
Glucose-Capillary: 101 mg/dL — ABNORMAL HIGH (ref 70–99)
Glucose-Capillary: 130 mg/dL — ABNORMAL HIGH (ref 70–99)

## 2012-04-21 LAB — COMPREHENSIVE METABOLIC PANEL
ALT: 9 U/L (ref 0–35)
Alkaline Phosphatase: 73 U/L (ref 39–117)
BUN: 20 mg/dL (ref 6–23)
CO2: 24 mEq/L (ref 19–32)
GFR calc Af Amer: 30 mL/min — ABNORMAL LOW (ref >90)
GFR calc non Af Amer: 26 mL/min — ABNORMAL LOW (ref >90)
Glucose, Bld: 120 mg/dL — ABNORMAL HIGH (ref 70–99)
Potassium: 4.1 mEq/L (ref 3.5–5.1)
Sodium: 144 mEq/L (ref 135–145)
Total Bilirubin: 0.3 mg/dL (ref 0.3–1.2)

## 2012-04-21 MED ORDER — ONDANSETRON HCL 4 MG/2ML IJ SOLN
4.0000 mg | Freq: Once | INTRAMUSCULAR | Status: AC
  Administered 2012-04-21: 4 mg via INTRAVENOUS
  Filled 2012-04-21: qty 2

## 2012-04-21 MED ORDER — TETANUS-DIPHTH-ACELL PERTUSSIS 5-2.5-18.5 LF-MCG/0.5 IM SUSP
0.5000 mL | Freq: Once | INTRAMUSCULAR | Status: AC
  Administered 2012-04-21: 0.5 mL via INTRAMUSCULAR
  Filled 2012-04-21: qty 0.5

## 2012-04-21 MED ORDER — ACETAMINOPHEN 325 MG PO TABS
650.0000 mg | ORAL_TABLET | ORAL | Status: DC | PRN
  Filled 2012-04-21: qty 2

## 2012-04-21 MED ORDER — SODIUM CHLORIDE 0.9 % IV SOLN
INTRAVENOUS | Status: DC
  Administered 2012-04-22: via INTRAVENOUS

## 2012-04-21 NOTE — ED Notes (Signed)
Ecchymosis noted to bilat knees; lac to mid occiput approx 1/4 inch in length; no bleeding noted at this time; large amt of swelling and ecchymosis noted to dorsal aspect of rgt hand extending into rgt wrist area; full ROM to all 5 digits of rgt hand; palpable radial pulse present

## 2012-04-21 NOTE — ED Notes (Signed)
Patient transported from X-ray 

## 2012-04-21 NOTE — Care Management Note (Unsigned)
    Page 1 of 1   04/21/2012     2:49:08 PM   CARE MANAGEMENT NOTE 04/21/2012  Patient:  [name], [name]   Account Number:  [id number]  Date Initiated:  04/21/2012  Documentation initiated by:  GRAVES-BIGELOW,Qualyn Oyervides  Subjective/Objective Assessment:   Pt admitted from a fall. Currently being treated for hypertension, stage III chronic kidney disease, and vertigo.     Action/Plan:   MD placed consult for PT/OT- CM will continue ot f/u for disposition needs.   Anticipated DC Date:  04/23/2012   Anticipated DC Plan:  HOME W HOME HEALTH SERVICES      DC Planning Services  CM consult      Choice offered to / List presented to:             Status of service:  In process, will continue to follow Medicare Important Message given?   (If response is "NO", the following Medicare IM given date fields will be blank) Date Medicare IM given:   Date Additional Medicare IM given:    Discharge Disposition:    Per UR Regulation:  Reviewed for med. necessity/level of care/duration of stay  If discussed at Long Length of Stay Meetings, dates discussed:    Comments:

## 2012-04-21 NOTE — ED Notes (Signed)
philly collar removed per MD.

## 2012-04-21 NOTE — Progress Notes (Signed)
UR Completed Kristion Holifield Graves-Bigelow, RN,BSN [phone]  

## 2012-04-21 NOTE — ED Provider Notes (Addendum)
History     CSN: [id number]  Arrival date & time 04/20/12  2352   First MD Initiated Contact with Patient 04/21/12 0002      Chief Complaint  Patient presents with  . Fall    became dizzy when getting out of bed to use restroom; hit head on nightstand - no LOC - approx 1/4 inch lac above occiput - no bleeding noted at this time; large hematoma noted to rgt hand with mod amt of ecchymosis present; ecchymosis noted to bilat knees     Patient is a 78 y.o. female presenting with fall. The history is provided by the patient and a friend.  Fall The accident occurred 1 to 2 hours ago. Incident: while getting out of bed. The point of impact was the head. The pain is moderate. Associated symptoms include loss of consciousness. Pertinent negatives include no abdominal pain and no vomiting. The symptoms are aggravated by activity. She has tried rest for the symptoms. The treatment provided no relief.    Pt presents s/p fall She was getting out of bed, fell and hit her head She reports LOC She reports dizziness with movement at this time No cp/sob reported No focal weakness reported She reports back pain She reports right hand and right knee pain from fall   Past Medical History  Diagnosis Date  . Heart murmur     History reviewed. No pertinent past surgical history.  History reviewed. No pertinent family history.  History  Substance Use Topics  . Smoking status: Not on file  . Smokeless tobacco: Not on file  . Alcohol Use:     OB History    Grav Para Term Preterm Abortions TAB SAB Ect Mult Living                  Review of Systems  Gastrointestinal: Negative for vomiting and abdominal pain.  Neurological: Positive for loss of consciousness.  All other systems reviewed and are negative.    Allergies  Review of patient's allergies indicates no known allergies.  Home Medications   Current Outpatient Rx  Name Route Sig Dispense Refill  . ASPIRIN EC 81 MG PO TBEC  Oral Take 81 mg by mouth daily.    [name] OVER THE COUNTER MEDICATION Oral Take 1 tablet by mouth daily. laxative      BP 185/107  Temp(Src) 98.2 F (36.8 C) (Oral)  Resp 20  SpO2 93% BP 171/52  Pulse 67  Temp(Src) 98.8 F (37.1 C) (Axillary)  Resp 21  SpO2 95%  Physical Exam CONSTITUTIONAL: Well developed/well nourished HEAD AND FACE: small laceration noted to occiput EYES: EOMI/PERRL ENMT: Mucous membranes moist, No evidence of facial/nasal trauma NECK: supple no meningeal signs SPINE:cervical spine nontender, tenderness to thoracic and lumbar spine, No bruising/crepitance/stepoffs noted to spine CV: S1/S2 noted, no murmurs/rubs/gallops noted LUNGS: Lungs are clear to auscultation bilaterally, no apparent distress ABDOMEN: soft, nontender, no rebound or guarding GU:no cva tenderness NEURO: Pt is awake/alert, moves all extremitiesx4, no arm or leg drift, GCS 15 EXTREMITIES: pulses normal, full ROM Tenderness to right patella.  Tenderness/bruising to right hand on dorsal surface, but full ROM of right wrist and full ROM of all fingers.  Distal cap refill less than 2 seconds on right hand No tenderness with ROM of either hip SKIN: warm, color normal PSYCH: no abnormalities of mood noted  ED Course  Procedures   Labs Reviewed  CBC  DIFFERENTIAL  BASIC METABOLIC PANEL  12:35 AM Pt is  s/p fall Will obtain imaging Advised CTL precautions and also c-collar until imaging is complete Will follow closely 1:32 AM Imaging negative Pt insisted on going home Re-examine - lac on head is small, not bleeding and not amenable to repair She has chronic pain in right shoulder that is not new She has no other new complaints 3:28 AM Pt with dizziness/vertigo with movement and high risk for falls She is now agreeable to observation admission/monitoring D/w dr Toniann Fail, to admit   MDM  Nursing notes reviewed and considered in documentation All labs/vitals reviewed and  considered xrays reviewed and considered        Date: 04/21/2012  Rate: 86  Rhythm: normal sinus rhythm  QRS Axis: normal  Intervals: PR prolonged  ST/T Wave abnormalities: nonspecific ST changes  Conduction Disutrbances:first-degree A-V block   Narrative Interpretation:   Old EKG Reviewed: unchanged    Joya Gaskins, MD 04/21/12 1478  Joya Gaskins, MD 04/21/12 0330

## 2012-04-21 NOTE — ED Notes (Signed)
Attempted to call report, RN unable to come to phone.  RN to call back.

## 2012-04-21 NOTE — ED Notes (Signed)
Placed on bedpan; remains supine with philly collar intact

## 2012-04-21 NOTE — Progress Notes (Signed)
MD notified when patient arrived to the floor.

## 2012-04-21 NOTE — ED Notes (Signed)
Patient transported to X-ray 

## 2012-04-21 NOTE — ED Notes (Addendum)
Attempted to ambulate pt - when sitting up on stretcher with feet dangling, pt became dizzy - states worse when looking at floor - attempted to raise pt's feet back onto stretcher - pt demanded we allow her to ambulate; upon standing, pt remained dizzy, took 3 steps, became more unsteady with an increase in dizziness; assisted back to bed; pt insistent that she was not going to stay in hospital overnight; pt lives alone and has no one at home to assist her with ambulation nor ADLs; MD aware of same

## 2012-04-21 NOTE — H&P (Signed)
[name] is an 78 y.o. female.   PCP - Dr.Bar. Chief Complaint: Fall. HPI: 78 year-old female with history of hypertension off medications for last 2 years as patient did not want to take the medications, lives alone had a fall yesterday when she tried to get out of bed. After the fall she was not able to ambulate and she alerted the emergency. The EMS came and brought her to the ER. Since her fall she has been feeling very dizzy. In the ER patient had CT head C-spine and x-rays of the right hand and knees. She has swelling of the right hand and knee. Patient in addition is also complaining of upper back pain on minimal movements. Since patient's dizziness has been persistent and patient lives alone patient was admitted for further workup.  Past Medical History  Diagnosis Date  . Heart murmur   . Hypertension     Past Surgical History  Procedure Date  . Abdominal hysterectomy     Family History  Problem Relation Age of Onset  . Uterine cancer Sister    Social History:  reports that she quit smoking about 43 years ago. Her smoking use included Cigarettes. She has never used smokeless tobacco. She reports that she does not drink alcohol or use illicit drugs.  Allergies: No Known Allergies  Medications Prior to Admission  Medication Sig Dispense Refill  . aspirin EC 81 MG tablet Take 81 mg by mouth daily.      [name] OVER THE COUNTER MEDICATION Take 1 tablet by mouth daily. laxative        Results for orders placed during the hospital encounter of 04/20/12 (from the past 48 hour(s))  CBC     Status: Abnormal   Collection Time   04/21/12 12:10 AM      Component Value Range Comment   WBC 11.9 (*) 4.0 - 10.5 (K/uL)    RBC 4.33  3.87 - 5.11 (MIL/uL)    Hemoglobin 12.1  12.0 - 15.0 (g/dL)    HCT 78.2 (*) 95.6 - 46.0 (%)    MCV 80.4  78.0 - 100.0 (fL)    MCH 27.9  26.0 - 34.0 (pg)    MCHC 34.8  30.0 - 36.0 (g/dL)    RDW 21.3  08.6 - 57.8 (%)    Platelets 307  150 - 400 (K/uL)     DIFFERENTIAL     Status: Abnormal   Collection Time   04/21/12 12:10 AM      Component Value Range Comment   Neutrophils Relative 66  43 - 77 (%)    Neutro Abs 7.8 (*) 1.7 - 7.7 (K/uL)    Lymphocytes Relative 25  12 - 46 (%)    Lymphs Abs 3.0  0.7 - 4.0 (K/uL)    Monocytes Relative 6  3 - 12 (%)    Monocytes Absolute 0.7  0.1 - 1.0 (K/uL)    Eosinophils Relative 2  0 - 5 (%)    Eosinophils Absolute 0.3  0.0 - 0.7 (K/uL)    Basophils Relative 0  0 - 1 (%)    Basophils Absolute 0.0  0.0 - 0.1 (K/uL)   BASIC METABOLIC PANEL     Status: Abnormal   Collection Time   04/21/12 12:10 AM      Component Value Range Comment   Sodium 143  135 - 145 (mEq/L)    Potassium 4.2  3.5 - 5.1 (mEq/L)    Chloride 110  96 - 112 (mEq/L)  CO2 24  19 - 32 (mEq/L)    Glucose, Bld 152 (*) 70 - 99 (mg/dL)    BUN 20  6 - 23 (mg/dL)    Creatinine, Ser 4.09 (*) 0.50 - 1.10 (mg/dL)    Calcium 8.9  8.4 - 10.5 (mg/dL)    GFR calc non Af Amer 25 (*) >90 (mL/min)    GFR calc Af Amer 29 (*) >90 (mL/min)   POCT I-STAT TROPONIN I     Status: Normal   Collection Time   04/21/12 12:26 AM      Component Value Range Comment   Troponin i, poc 0.02  0.00 - 0.08 (ng/mL)    Comment 3             Dg Thoracic Spine 2 View  04/21/2012  *RADIOLOGY REPORT*  Clinical Data: Back pain after dizziness and falling.  THORACIC SPINE - 2 VIEW  Comparison: Chest 09/11/2009.  Findings: Normal alignment of the thoracic vertebra.  Degenerative changes throughout the thoracic spine with narrowed thoracic interspaces and endplate osteophytes.  Bridging osteophytes in the lower thoracic region anteriorly.  No vertebral compression deformities.  No focal bone lesion or bone destruction.  Bone cortex and trabecular architecture appear intact.  There appears to be infiltration or atelectasis in the left lung base.  No paraspinal soft tissue swelling.  IMPRESSION: Degenerative changes throughout the thoracic spine.  No displaced fractures identified.   Suggestion of infiltration or atelectasis in the left lung base.  Original Report Authenticated By: Marlon Pel, M.D.   Dg Lumbar Spine Complete  04/21/2012  *RADIOLOGY REPORT*  Clinical Data: Back pain after dizziness and fall.  LUMBAR SPINE - COMPLETE 4+ VIEW  Comparison: Abdomen 04/03/2005  Findings: Five lumbar type vertebra.  Normal alignment of the lumbar vertebrae and facet joints.  Diffuse degenerative changes with narrowed lumbar interspaces and associated endplate hypertrophic changes.  No vertebral compression deformities.  No focal bone lesion or bone destruction.  Bone cortex and trabecular architecture appear intact.  Postoperative change in the right hip. Vascular calcifications.  Focal irregularity of the coccygeal spine likely represents old fracture deformity.  IMPRESSION: Degenerative changes in the lumbar spine. No acute fractures identified.  Original Report Authenticated By: Marlon Pel, M.D.   Dg Knee 2 Views Right  04/21/2012  *RADIOLOGY REPORT*  Clinical Data: Knee pain after dizziness and fall.  RIGHT KNEE - 1-2 VIEW  Comparison: None  Findings: Prominent tricompartmental degenerative changes in the right knee with compartment narrowing and hypertrophic changes. Slight irregularity of the posterior tibial plateau which is probably due to degenerative change but nondisplaced impaction fracture is not excluded.  No significant effusion.  Soft tissue swelling/hematoma over the inferior patellar ligament.  IMPRESSION: Prominent degenerative changes in the right knee.  Subcutaneous soft tissue hematoma anterior to the inferior patellar ligament. Sclerosis and irregularity of the tibial plateau is probably due to degenerative change but impacted fractures not excluded.  Original Report Authenticated By: Marlon Pel, M.D.   Ct Head Wo Contrast  04/21/2012  *RADIOLOGY REPORT*  Clinical Data:  Larey Seat out of bed.  Head pain.  Neck pain.  CT HEAD WITHOUT CONTRAST CT  CERVICAL SPINE WITHOUT CONTRAST  Technique:  Multidetector CT imaging of the head and cervical spine was performed following the standard protocol without intravenous contrast.  Multiplanar CT image reconstructions of the cervical spine were also generated.  Comparison:   None  CT HEAD  Findings: There is no evidence for acute infarction,  intracranial hemorrhage, mass lesion, hydrocephalus, or extra-axial fluid.  Mild atrophy is present.  There is mild chronic microvascular ischemic change.  The calvarium is intact.  There is no acute sinus or mastoid disease.  Mild vascular calcification is present.  IMPRESSION: Age related atrophy with chronic microvascular ischemic change.  No skull fracture or intracranial hemorrhage.  CT CERVICAL SPINE  Findings: There is no visible cervical spine fracture or traumatic subluxation.  No prevertebral soft tissue swelling or intraspinal hematoma is seen.  Moderate spondylosis with disc space narrowing is present from C3-C6.  Mild degenerative change noted at C1-C2. Mild vascular calcification.  27 x 36 mm right thyroid lesion, displacing the airway from right to left, probable goiter but incompletely evaluated on this study.  Mild scarring at the lung apices but no pneumothorax.  IMPRESSION: Cervical spondylosis. No fracture or traumatic subluxation.  Probable goiter, with right thyroid gland enlargement and displacement the trachea right to left.  Original Report Authenticated By: Elsie Stain, M.D.   Ct Cervical Spine Wo Contrast  04/21/2012  *RADIOLOGY REPORT*  Clinical Data:  Larey Seat out of bed.  Head pain.  Neck pain.  CT HEAD WITHOUT CONTRAST CT CERVICAL SPINE WITHOUT CONTRAST  Technique:  Multidetector CT imaging of the head and cervical spine was performed following the standard protocol without intravenous contrast.  Multiplanar CT image reconstructions of the cervical spine were also generated.  Comparison:   None  CT HEAD  Findings: There is no evidence for acute  infarction, intracranial hemorrhage, mass lesion, hydrocephalus, or extra-axial fluid.  Mild atrophy is present.  There is mild chronic microvascular ischemic change.  The calvarium is intact.  There is no acute sinus or mastoid disease.  Mild vascular calcification is present.  IMPRESSION: Age related atrophy with chronic microvascular ischemic change.  No skull fracture or intracranial hemorrhage.  CT CERVICAL SPINE  Findings: There is no visible cervical spine fracture or traumatic subluxation.  No prevertebral soft tissue swelling or intraspinal hematoma is seen.  Moderate spondylosis with disc space narrowing is present from C3-C6.  Mild degenerative change noted at C1-C2. Mild vascular calcification.  27 x 36 mm right thyroid lesion, displacing the airway from right to left, probable goiter but incompletely evaluated on this study.  Mild scarring at the lung apices but no pneumothorax.  IMPRESSION: Cervical spondylosis. No fracture or traumatic subluxation.  Probable goiter, with right thyroid gland enlargement and displacement the trachea right to left.  Original Report Authenticated By: Elsie Stain, M.D.   Dg Hand Complete Right  04/21/2012  *RADIOLOGY REPORT*  Clinical Data: Bruising and swelling after fall.  RIGHT HAND - COMPLETE 3+ VIEW  Comparison: None.  Findings: Degenerative changes involving the radial carpal, carpal, metacarpal phalangeal, and interphalangeal joints.  No focal bone destruction.  No evidence of acute fracture or subluxation.  Dorsal soft tissue swelling over the metacarpal heads.  IMPRESSION: Diffuse degenerative changes.  No acute fractures identified.  Soft tissue swelling.  Original Report Authenticated By: Marlon Pel, M.D.    Review of Systems  Constitutional: Negative.   HENT: Negative.   Eyes: Negative.   Respiratory: Negative.   Cardiovascular: Negative.   Gastrointestinal: Negative.   Genitourinary: Negative.   Musculoskeletal: Positive for back pain  (upper mid back pain.), joint pain and falls.  Skin: Negative.   Neurological: Positive for dizziness.  Psychiatric/Behavioral: Negative.     Blood pressure 183/79, pulse 77, temperature 97.4 F (36.3 C), temperature source Oral, resp. rate 20,  height 5\' 2"  (1.575 m), weight 88.6 kg (195 lb 5.2 oz), SpO2 97.00%. Physical Exam  Constitutional: She is oriented to person, place, and time. She appears well-developed and well-nourished. No distress.  HENT:  Head: Normocephalic.  Right Ear: External ear normal.  Left Ear: External ear normal.  Nose: Nose normal.  Mouth/Throat: Oropharynx is clear and moist. No oropharyngeal exudate.       Small scalp laceration.on the left parietal area.  Eyes: Conjunctivae are normal. Pupils are equal, round, and reactive to light. Right eye exhibits no discharge. Left eye exhibits no discharge. No scleral icterus.  Neck: Normal range of motion. Neck supple.  Cardiovascular: Normal rate and regular rhythm.   Respiratory: Effort normal and breath sounds normal. No respiratory distress. She has no wheezes. She has no rales.  GI: Soft. Bowel sounds are normal. She exhibits no distension. There is no tenderness.  Musculoskeletal:       Swelling of the right hand with discoloration. Right knee swelling.  Neurological: She is alert and oriented to person, place, and time.       Moves all extremities. No obvious facial asymmetry.  Skin: She is not diaphoretic.     Assessment/Plan #1. Dizziness - patient feels like things spinning around when she tries to walk. When she is lying position she is fine. We'll get an MRI brain. Otherwise patient is nonfocal and has no dysarthria or nystagmus of nausea vomiting. Most likely it is from benign positional vertigo. Get PT consult and plan for safe discharge given the fact patient lives alone. #2. Renal insufficiency - follow repeat metabolic panel. Gently hydrate for now. #3. Right hand hematoma - I have consulted on call  hand surgeon Dr. Amanda Pea. We'll follow his recommendations. #4. Right knee swelling - patient has no significant pain on moving the knee. X-rays don't show definite fractures. We'll get PT opinion. #5. Upper mid back pain - patient has significant pain in the upper thoracic spine. We'll get MRI of the T-spine. #6. History of hypertension off medications for last 2 years.  CODE STATUS - full code.  Eduard Clos 04/21/2012, 6:31 AM

## 2012-04-21 NOTE — Consult Note (Signed)
Reason for Consult: Right hand swelling and ecchysmosis Referring Physician: Dr. Edward Qualia Joy is an 78 y.o. [name].  HPI: The patient is a very pleasant 78 year old [name] who unfortunately fell while at home, she states she lost her balance she was unable to stand back up and called 911. She was seen and evaluated in the emergency room setting and the decision was made to admit the patient for evaluation of the above events. Patient has a history of hypertension, untreated she does not take her medications, and a history of a heart murmur. The patient is admitted by hospitalist services, Dr. Waymon Amato. She is currently being treated for hypertension, stage III chronic kidney disease, and vertigo. Her current workup has been negative for any acute findings in terms of her CT scan of the head or C-spine. Patient was noted to have sustained a significant contusive injury to the right hand with swelling and ecchymosis, her plain films are negative other than the degenerative findings. Given the degree of swelling and ecchymosis we will consult to in regards to the right hand. We are certainly happy to see the patient and participate in her care. Her chart is reviewed at length. Past Medical History  Diagnosis Date  . Heart murmur   . Hypertension     Past Surgical History  Procedure Date  . Abdominal hysterectomy     Family History  Problem Relation Age of Onset  . Uterine cancer Sister     Social History:  reports that she quit smoking about 43 years ago. Her smoking use included Cigarettes. She has never used smokeless tobacco. She reports that she does not drink alcohol or use illicit drugs.  Allergies: No Known Allergies  Medications: Medications are reviewed  Results for orders placed during the hospital encounter of 04/20/12 (from the past 48 hour(s))  CBC     Status: Abnormal   Collection Time   04/21/12 12:10 AM      Component Value Range Comment   WBC 11.9 (*) 4.0 -  10.5 (K/uL)    RBC 4.33  3.87 - 5.11 (MIL/uL)    Hemoglobin 12.1  12.0 - 15.0 (g/dL)    HCT 16.1 (*) 09.6 - 46.0 (%)    MCV 80.4  78.0 - 100.0 (fL)    MCH 27.9  26.0 - 34.0 (pg)    MCHC 34.8  30.0 - 36.0 (g/dL)    RDW 04.5  40.9 - 81.1 (%)    Platelets 307  150 - 400 (K/uL)   DIFFERENTIAL     Status: Abnormal   Collection Time   04/21/12 12:10 AM      Component Value Range Comment   Neutrophils Relative 66  43 - 77 (%)    Neutro Abs 7.8 (*) 1.7 - 7.7 (K/uL)    Lymphocytes Relative 25  12 - 46 (%)    Lymphs Abs 3.0  0.7 - 4.0 (K/uL)    Monocytes Relative 6  3 - 12 (%)    Monocytes Absolute 0.7  0.1 - 1.0 (K/uL)    Eosinophils Relative 2  0 - 5 (%)    Eosinophils Absolute 0.3  0.0 - 0.7 (K/uL)    Basophils Relative 0  0 - 1 (%)    Basophils Absolute 0.0  0.0 - 0.1 (K/uL)   BASIC METABOLIC PANEL     Status: Abnormal   Collection Time   04/21/12 12:10 AM      Component Value Range Comment   Sodium 143  135 - 145 (mEq/L)    Potassium 4.2  3.5 - 5.1 (mEq/L)    Chloride 110  96 - 112 (mEq/L)    CO2 24  19 - 32 (mEq/L)    Glucose, Bld 152 (*) 70 - 99 (mg/dL)    BUN 20  6 - 23 (mg/dL)    Creatinine, Ser 8.29 (*) 0.50 - 1.10 (mg/dL)    Calcium 8.9  8.4 - 10.5 (mg/dL)    GFR calc non Af Amer 25 (*) >90 (mL/min)    GFR calc Af Amer 29 (*) >90 (mL/min)   POCT I-STAT TROPONIN I     Status: Normal   Collection Time   04/21/12 12:26 AM      Component Value Range Comment   Troponin i, poc 0.02  0.00 - 0.08 (ng/mL)    Comment 3            COMPREHENSIVE METABOLIC PANEL     Status: Abnormal   Collection Time   04/21/12  7:00 AM      Component Value Range Comment   Sodium 144  135 - 145 (mEq/L)    Potassium 4.1  3.5 - 5.1 (mEq/L)    Chloride 109  96 - 112 (mEq/L)    CO2 24  19 - 32 (mEq/L)    Glucose, Bld 120 (*) 70 - 99 (mg/dL)    BUN 20  6 - 23 (mg/dL)    Creatinine, Ser 5.62 (*) 0.50 - 1.10 (mg/dL)    Calcium 8.9  8.4 - 10.5 (mg/dL)    Total Protein 6.5  6.0 - 8.3 (g/dL)    Albumin  2.9 (*) 3.5 - 5.2 (g/dL)    AST 13  0 - 37 (U/L)    ALT 9  0 - 35 (U/L)    Alkaline Phosphatase 73  39 - 117 (U/L)    Total Bilirubin 0.3  0.3 - 1.2 (mg/dL)    GFR calc non Af Amer 26 (*) >90 (mL/min)    GFR calc Af Amer 30 (*) >90 (mL/min)   HEMOGLOBIN A1C     Status: Abnormal   Collection Time   04/21/12  7:00 AM      Component Value Range Comment   Hemoglobin A1C 6.5 (*) <5.7 (%)    Mean Plasma Glucose 140 (*) <117 (mg/dL)   GLUCOSE, CAPILLARY     Status: Abnormal   Collection Time   04/21/12  7:38 AM      Component Value Range Comment   Glucose-Capillary 101 (*) 70 - 99 (mg/dL)   GLUCOSE, CAPILLARY     Status: Abnormal   Collection Time   04/21/12 11:43 AM      Component Value Range Comment   Glucose-Capillary 101 (*) 70 - 99 (mg/dL)     Dg Thoracic Spine 2 View  04/21/2012  *RADIOLOGY REPORT*  Clinical Data: Back pain after dizziness and falling.  THORACIC SPINE - 2 VIEW  Comparison: Chest 09/11/2009.  Findings: Normal alignment of the thoracic vertebra.  Degenerative changes throughout the thoracic spine with narrowed thoracic interspaces and endplate osteophytes.  Bridging osteophytes in the lower thoracic region anteriorly.  No vertebral compression deformities.  No focal bone lesion or bone destruction.  Bone cortex and trabecular architecture appear intact.  There appears to be infiltration or atelectasis in the left lung base.  No paraspinal soft tissue swelling.  IMPRESSION: Degenerative changes throughout the thoracic spine.  No displaced fractures identified.  Suggestion of infiltration or atelectasis in the left  lung base.  Original Report Authenticated By: Marlon Pel, M.D.   Dg Lumbar Spine Complete  04/21/2012  *RADIOLOGY REPORT*  Clinical Data: Back pain after dizziness and fall.  LUMBAR SPINE - COMPLETE 4+ VIEW  Comparison: Abdomen 04/03/2005  Findings: Five lumbar type vertebra.  Normal alignment of the lumbar vertebrae and facet joints.  Diffuse degenerative  changes with narrowed lumbar interspaces and associated endplate hypertrophic changes.  No vertebral compression deformities.  No focal bone lesion or bone destruction.  Bone cortex and trabecular architecture appear intact.  Postoperative change in the right hip. Vascular calcifications.  Focal irregularity of the coccygeal spine likely represents old fracture deformity.  IMPRESSION: Degenerative changes in the lumbar spine. No acute fractures identified.  Original Report Authenticated By: Marlon Pel, M.D.   Dg Knee 2 Views Right  04/21/2012  *RADIOLOGY REPORT*  Clinical Data: Knee pain after dizziness and fall.  RIGHT KNEE - 1-2 VIEW  Comparison: None  Findings: Prominent tricompartmental degenerative changes in the right knee with compartment narrowing and hypertrophic changes. Slight irregularity of the posterior tibial plateau which is probably due to degenerative change but nondisplaced impaction fracture is not excluded.  No significant effusion.  Soft tissue swelling/hematoma over the inferior patellar ligament.  IMPRESSION: Prominent degenerative changes in the right knee.  Subcutaneous soft tissue hematoma anterior to the inferior patellar ligament. Sclerosis and irregularity of the tibial plateau is probably due to degenerative change but impacted fractures not excluded.  Original Report Authenticated By: Marlon Pel, M.D.   Ct Head Wo Contrast  04/21/2012  *RADIOLOGY REPORT*  Clinical Data:  Larey Seat out of bed.  Head pain.  Neck pain.  CT HEAD WITHOUT CONTRAST CT CERVICAL SPINE WITHOUT CONTRAST  Technique:  Multidetector CT imaging of the head and cervical spine was performed following the standard protocol without intravenous contrast.  Multiplanar CT image reconstructions of the cervical spine were also generated.  Comparison:   None  CT HEAD  Findings: There is no evidence for acute infarction, intracranial hemorrhage, mass lesion, hydrocephalus, or extra-axial fluid.  Mild atrophy  is present.  There is mild chronic microvascular ischemic change.  The calvarium is intact.  There is no acute sinus or mastoid disease.  Mild vascular calcification is present.  IMPRESSION: Age related atrophy with chronic microvascular ischemic change.  No skull fracture or intracranial hemorrhage.  CT CERVICAL SPINE  Findings: There is no visible cervical spine fracture or traumatic subluxation.  No prevertebral soft tissue swelling or intraspinal hematoma is seen.  Moderate spondylosis with disc space narrowing is present from C3-C6.  Mild degenerative change noted at C1-C2. Mild vascular calcification.  27 x 36 mm right thyroid lesion, displacing the airway from right to left, probable goiter but incompletely evaluated on this study.  Mild scarring at the lung apices but no pneumothorax.  IMPRESSION: Cervical spondylosis. No fracture or traumatic subluxation.  Probable goiter, with right thyroid gland enlargement and displacement the trachea right to left.  Original Report Authenticated By: Elsie Stain, M.D.   Ct Cervical Spine Wo Contrast  04/21/2012  *RADIOLOGY REPORT*  Clinical Data:  Larey Seat out of bed.  Head pain.  Neck pain.  CT HEAD WITHOUT CONTRAST CT CERVICAL SPINE WITHOUT CONTRAST  Technique:  Multidetector CT imaging of the head and cervical spine was performed following the standard protocol without intravenous contrast.  Multiplanar CT image reconstructions of the cervical spine were also generated.  Comparison:   None  CT HEAD  Findings: There  is no evidence for acute infarction, intracranial hemorrhage, mass lesion, hydrocephalus, or extra-axial fluid.  Mild atrophy is present.  There is mild chronic microvascular ischemic change.  The calvarium is intact.  There is no acute sinus or mastoid disease.  Mild vascular calcification is present.  IMPRESSION: Age related atrophy with chronic microvascular ischemic change.  No skull fracture or intracranial hemorrhage.  CT CERVICAL SPINE  Findings:  There is no visible cervical spine fracture or traumatic subluxation.  No prevertebral soft tissue swelling or intraspinal hematoma is seen.  Moderate spondylosis with disc space narrowing is present from C3-C6.  Mild degenerative change noted at C1-C2. Mild vascular calcification.  27 x 36 mm right thyroid lesion, displacing the airway from right to left, probable goiter but incompletely evaluated on this study.  Mild scarring at the lung apices but no pneumothorax.  IMPRESSION: Cervical spondylosis. No fracture or traumatic subluxation.  Probable goiter, with right thyroid gland enlargement and displacement the trachea right to left.  Original Report Authenticated By: Elsie Stain, M.D.   Dg Hand Complete Right  04/21/2012  *RADIOLOGY REPORT*  Clinical Data: Bruising and swelling after fall.  RIGHT HAND - COMPLETE 3+ VIEW  Comparison: None.  Findings: Degenerative changes involving the radial carpal, carpal, metacarpal phalangeal, and interphalangeal joints.  No focal bone destruction.  No evidence of acute fracture or subluxation.  Dorsal soft tissue swelling over the metacarpal heads.  IMPRESSION: Diffuse degenerative changes.  No acute fractures identified.  Soft tissue swelling.  Original Report Authenticated By: Marlon Pel, M.D.    Review of Systems  Constitutional: Positive for malaise/fatigue.  HENT:       Scalp laceration and hematoma  Eyes: Negative.   Respiratory: Negative.   Cardiovascular: Positive for leg swelling.  Gastrointestinal: Negative.   Musculoskeletal: Positive for joint pain and falls.       See above  Skin: Negative.    Blood pressure 196/65, pulse 67, temperature 98.3 F (36.8 C), temperature source Oral, resp. rate 18, height 5\' 2"  (1.575 m), weight 88.6 kg (195 lb 5.2 oz), SpO2 94.00%. Physical Exam The patient is pleasant, alert and oriented she is in no acute distress Head: Significant her scalp laceration hematoma about the crown of the head. Chest:  Chest equal bilateral expansions present respirations are nonlabored.  Heart S1-S2  Abdomen nontender  Right upper extremity: She has obvious swelling and ecchymosis about the dorsal aspect of the right hand primarily over the first web space to include the second and third metacarpal regions and extending proximally to the midportion of the dorsal hand her sensation and refill are intact to the upper extremity. FDS and FDP are intact to the digits FPL and EPL are intact to the thumb, and intrinsics fine nicely first dorsal and osseous is intact, APB is intact, wrist range of motion is nontender, she is tender with general palpation over the ecchymotic area of the hand,digital range of motion is nontender actively Right lower extremity: She is not having effusion about the right knee no gross ecchymosis at this juncture, she is tender to palpation she has difficulty weightbearing. Straight leg raise is intact. She's diffusely tender about the anteromedial and anterolateral jointline as well as with compression of the patella region Assessment/Plan: Right hand dorsal contusive injury with noted hematoma no obvious fracture process at this juncture Right knee effusion status post a fall with pain with weight-bearing with noted degenerative changes her radiographs cannot rule out fracture Vertigo  Fall  Scalp hematoma  Renal insufficiency   In regards to the right hand we have discussed with the patient at conservative treatment regime, we need to be diligent in regards to skin care and thus I placed Xeroform over the dorsal hand today followed by bulky soft compressive wrap prevent re- injury and further contusions. She may use the hand as tolerated, allowing pain to be her guide. Given her difficulty with weightbearing about the right lower extremity, her recent fall and effusion about the knee certainly consideration for a CT scan to rule out an occult fracture may be viable and will be  ordered. Wound we'll follow along with patient please call us for any questions or concerns thank you.  Joy [name] L 04/21/2012, 1:16 PM
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
## 2012-04-21 NOTE — ED Notes (Signed)
philly collar applied while maintaining c-spine control; good PMS to all 4 extremities prior to and after application of collar

## 2012-04-22 DIAGNOSIS — R42 Dizziness and giddiness: Secondary | ICD-10-CM

## 2012-04-22 DIAGNOSIS — I359 Nonrheumatic aortic valve disorder, unspecified: Secondary | ICD-10-CM

## 2012-04-22 DIAGNOSIS — N189 Chronic kidney disease, unspecified: Secondary | ICD-10-CM

## 2012-04-22 DIAGNOSIS — I10 Essential (primary) hypertension: Secondary | ICD-10-CM

## 2012-04-22 LAB — LIPID PANEL: HDL: 87 mg/dL (ref >39)

## 2012-04-22 LAB — BASIC METABOLIC PANEL
BUN: 25 mg/dL — ABNORMAL HIGH (ref 6–23)
GFR calc Af Amer: 28 mL/min — ABNORMAL LOW (ref >90)
GFR calc non Af Amer: 24 mL/min — ABNORMAL LOW (ref >90)
Potassium: 4.1 mEq/L (ref 3.5–5.1)
Sodium: 143 mEq/L (ref 135–145)

## 2012-04-22 LAB — GLUCOSE, CAPILLARY
Glucose-Capillary: 115 mg/dL — ABNORMAL HIGH (ref 70–99)
Glucose-Capillary: 112 mg/dL — ABNORMAL HIGH (ref 70–99)
Glucose-Capillary: 124 mg/dL — ABNORMAL HIGH (ref 70–99)

## 2012-04-22 LAB — CBC
HCT: 35.9 % — ABNORMAL LOW (ref 36.0–46.0)
MCHC: 33.7 g/dL (ref 30.0–36.0)
Platelets: 315 10*3/uL (ref 150–400)
RDW: 15.1 % (ref 11.5–15.5)

## 2012-04-22 MED ORDER — ASPIRIN 81 MG PO CHEW
CHEWABLE_TABLET | ORAL | Status: AC
  Filled 2012-04-22: qty 1

## 2012-04-22 MED ORDER — ASPIRIN EC 81 MG PO TBEC
81.0000 mg | DELAYED_RELEASE_TABLET | Freq: Every day | ORAL | Status: DC
  Administered 2012-04-23 – 2012-04-24 (×2): 81 mg via ORAL
  Filled 2012-04-22 (×5): qty 1

## 2012-04-22 MED ORDER — MECLIZINE HCL 12.5 MG PO TABS
12.5000 mg | ORAL_TABLET | Freq: Three times a day (TID) | ORAL | Status: DC | PRN
Start: 2012-04-22 — End: 2012-04-23
  Filled 2012-04-22: qty 1

## 2012-04-22 NOTE — Progress Notes (Signed)
VASCULAR LAB PRELIMINARY  PRELIMINARY  PRELIMINARY  PRELIMINARY  Carotid Dopplers completed.    Preliminary report:  Right: 40-59% ICA stenosis, mid range of scale.  Left: 40-59% ICA stenosis, highest end of scale.  The left ICA is tortuous.  Bilateral vertebral artery flow is antegrade.  Sherren Kerns Thayer, 04/22/2012, 11:09 AM
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
## 2012-04-22 NOTE — Evaluation (Signed)
Physical Therapy  VESTIBULAR Evaluation Patient Details Name: [name] MRN: [id number] DOB: [birth year] Today's Date: 04/22/2012 Time: 1115-1204 PT Time Calculation (min): 49 min  PT Assessment / Plan / Recommendation Clinical Impression  pt adm after fall with one complaint being dizziness.  Questioning led me to thing BPPV. Testing  produced  pos. torsional nystagmus with Hallpike-Dix to L, but neg on the R.  Epply completed 2 times for L BPPV.  Will return 5/31 to reassess vertigo.    PT Assessment  Patient needs continued PT services    Follow Up Recommendations  Outpatient PT    Barriers to Discharge Decreased caregiver support      lEquipment Recommendations  Defer to next venue    Recommendations for Other Services     Frequency Min 3X/week    Precautions / Restrictions Precautions Precautions: Fall Restrictions Weight Bearing Restrictions: No   Pertinent Vitals/Pain       Mobility  Bed Mobility Bed Mobility: Rolling Right;Supine to Sit;Sitting - Scoot to [place] of Bed;Sit to Supine Rolling Right: 5: Supervision Supine to Sit: 5: Supervision Sitting - Scoot to Edge of Bed: 5: Supervision Sit to Supine: 5: Supervision Details for Bed Mobility Assistance: no cuing or assist needed Transfers Transfers: Sit to Stand;Stand to Sit;Stand Pivot Transfers Sit to Stand: 5: Supervision Stand to Sit: 5: Supervision Stand Pivot Transfers: 5: Supervision Details for Transfer Assistance: standy by for safety Ambulation/Gait Ambulation/Gait Assistance: Not tested (comment)    Exercises     PT Diagnosis: Other (comment) (BPPV)  PT Problem List: Decreased activity tolerance;Other (comment) (BPPV) PT Treatment Interventions: Other (comment) (Vestibular assessment/treat)   PT Goals Acute Rehab PT Goals PT Goal Formulation: With patient/family Time For Goal Achievement: 04/29/12 Potential to Achieve Goals: Good Pt will go Supine/Side to Sit: with modified  independence PT Goal: Supine/Side to Sit - Progress: Goal set today Pt will go Sit to Stand: with modified independence PT Goal: Sit to Stand - Progress: Goal set today Pt will Ambulate: 51 - 150 feet;with modified independence;with least restrictive assistive device PT Goal: Ambulate - Progress: Goal set today Additional Goals Additional Goal #1: continue CRP until no nystamus with positional changes and intensity of dizziness<+=3/10 PT Goal: Additional Goal #1 - Progress: Goal set today  Visit Information  Last PT Received On: 04/22/12 Assistance Needed: +1    Subjective Data  Subjective: It spins when I get up Patient Stated Goal: get rid of the feeling   Prior Functioning  Home Living Lives With: Alone Available Help at Discharge: Personal care attendant;Other (Comment) (2 hours/day, 5 days/week) Type of Home: House Home Access: Stairs to enter [employer] of Steps: 2 Entrance Stairs-Rails: Right;Left Home Layout: One level Bathroom Shower/Tub: Health visitor: Standard Home Adaptive Equipment: Bedside commode/3-in-1;Built-in shower seat;Grab bars in shower;Straight cane Prior Function Level of Independence: Independent (most of the day in her home) Able to Take Stairs?: Yes Driving: No Communication Communication: No difficulties    Cognition  Overall Cognitive Status: Appears within functional limits for tasks assessed/performed Arousal/Alertness: Awake/alert Orientation Level: Oriented X4 / Intact Behavior During Session: [facility] for tasks performed    Extremity/Trunk Assessment Right Lower Extremity Assessment RLE ROM/Strength/Tone: Within functional levels Left Lower Extremity Assessment LLE ROM/Strength/Tone: Within functional levels Trunk Assessment Trunk Assessment: Normal   Balance Static Sitting Balance Static Sitting - Balance Support: No upper extremity supported;Feet supported Static Sitting - Level of Assistance: 5: Stand by  assistance  End of Session PT - End of  Session Activity Tolerance: Patient tolerated treatment well Patient left: in bed;with call bell/phone within reach Nurse Communication: Mobility status   Shamela Haydon, Eliseo Gum 04/22/2012, 12:28 PM  04/22/2012  Elliott Bing, PT [phone] (pager)

## 2012-04-22 NOTE — Progress Notes (Signed)
  Echocardiogram 2D Echocardiogram has been performed.  [name] L 04/22/2012, 2:30 PM

## 2012-04-22 NOTE — Progress Notes (Signed)
   CARE MANAGEMENT NOTE 04/22/2012  Patient:  [name], [name]   Account Number:  [id number]  Date Initiated:  04/21/2012  Documentation initiated by:  GRAVES-BIGELOW,BRENDA  Subjective/Objective Assessment:   Pt admitted from a fall. Currently being treated for hypertension, stage III chronic kidney disease, and vertigo.     Action/Plan:   MD placed consult for PT/OT- CM will continue ot f/u for disposition needs.   Anticipated DC Date:  04/23/2012   Anticipated DC Plan:  HOME W HOME HEALTH SERVICES      DC Planning Services  CM consult      Choice offered to / List presented to:             Status of service:  In process, will continue to follow Medicare Important Message given?   (If response is "NO", the following Medicare IM given date fields will be blank) Date Medicare IM given:   Date Additional Medicare IM given:    Discharge Disposition:    Per UR Regulation:  Reviewed for med. necessity/level of care/duration of stay  If discussed at Long Length of Stay Meetings, dates discussed:    Comments:  04/22/2012  [address] RN, [place] [phone] Met with patient to discuss CM and discharge planning. She lives at home alone. DME: walker, cane Home health services with [facility]: nursing assistant 4 days/week 2hours /day 9 am to 11 am  RN one day per week. CM to continue to follow for discharge needs  [facility]  [phone] spoke with Corrie Dandy advised of inpatient status and will update with dc plans

## 2012-04-22 NOTE — Progress Notes (Signed)
OT Cancellation Note  Treatment cancelled today due to patient receiving procedure or test. Will re-attempt as time allows.  Casen Pryor, OTR/L Pager [phone] 04/22/2012, 10:45 AM

## 2012-04-23 DIAGNOSIS — R42 Dizziness and giddiness: Secondary | ICD-10-CM

## 2012-04-23 DIAGNOSIS — I10 Essential (primary) hypertension: Secondary | ICD-10-CM

## 2012-04-23 DIAGNOSIS — N189 Chronic kidney disease, unspecified: Secondary | ICD-10-CM

## 2012-04-23 LAB — GLUCOSE, CAPILLARY: Glucose-Capillary: 191 mg/dL — ABNORMAL HIGH (ref 70–99)

## 2012-04-23 MED ORDER — AMLODIPINE BESYLATE 10 MG PO TABS
10.0000 mg | ORAL_TABLET | Freq: Every day | ORAL | Status: DC
Start: 2012-04-24 — End: 2012-04-24
  Administered 2012-04-24: 10 mg via ORAL
  Filled 2012-04-23: qty 1

## 2012-04-23 MED ORDER — AMLODIPINE BESYLATE 5 MG PO TABS
5.0000 mg | ORAL_TABLET | Freq: Every day | ORAL | Status: DC
  Administered 2012-04-23: 5 mg via ORAL
  Filled 2012-04-23: qty 1

## 2012-04-23 MED ORDER — MECLIZINE HCL 12.5 MG PO TABS
12.5000 mg | ORAL_TABLET | Freq: Three times a day (TID) | ORAL | Status: DC
  Administered 2012-04-23 – 2012-04-24 (×2): 12.5 mg via ORAL
  Filled 2012-04-23 (×5): qty 1

## 2012-04-23 MED ORDER — AMLODIPINE BESYLATE 5 MG PO TABS
5.0000 mg | ORAL_TABLET | Freq: Once | ORAL | Status: DC
  Filled 2012-04-23: qty 1

## 2012-04-23 NOTE — Progress Notes (Signed)
Utilization review completed. Karmyn Lowman RN, CCM  

## 2012-04-23 NOTE — Progress Notes (Signed)
Patient ID: [name], female   DOB: 12/10/1916, 78 y.o.   MRN: [id number] . Subjective: Pt. Awake this am, in excellent spirits. She states she is " sore all over". She feels her hand is much improved with less pain and swelling. Mobility improved. She still is having intermittent vertigo. She denies nausea/vomiting/fever/chills.   Objective: Vital signs in last 24 hours: Temp:  [96.8 F (36 C)-98.6 F (37 C)] 97.5 F (36.4 C) (05/31 0427) Pulse Rate:  [57-68] 63  (05/31 0427) Resp:  [17-21] 18  (05/31 0427) BP: (166-199)/(43-87) 199/56 mmHg (05/31 0427) SpO2:  [93 %-97 %] 97 % (05/31 0427) Weight:  [89.449 kg (197 lb 3.2 oz)] 89.449 kg (197 lb 3.2 oz) (05/30 2222)  Intake/Output from previous day: 05/30 0701 - 05/31 0700 In: 240 [P.O.:240] Out: 600 [Urine:600] Intake/Output this shift:     Basename 04/22/12 0530 04/21/12 0010  HGB 12.1 12.1    Basename 04/22/12 0530 04/21/12 0010  WBC 11.6* 11.9*  RBC 4.38 4.33  HCT 35.9* 34.8*  PLT 315 307    Basename 04/22/12 0530 04/21/12 0700  NA 143 144  K 4.1 4.1  CL 107 109  CO2 25 24  BUN 25* 20  CREATININE 1.73* 1.65*  GLUCOSE 110* 120*  CALCIUM 9.1 8.9   No results found for this basename: LABPT:2,INR:2 in the last 72 hours  MRI brain negative for acute findings CT of right knee consistent with degenerative findings but know acute fracture PE: Pleasant,NAD, A&Ox3 Chest- equal exp, respirations non-labored ABD- NT R Hand- dsg removed, swelling significantly improved, ecchymotic, digital rom intact, sensation and refill intact, pain overall improved  Assessment/Plan: S/p fall with contusive injury to right doral hand and developing hematoma, improved with compressive wrap and conservative care Plan: at this juncture in regards to her hand she simply needs a compressive wrap for comfort, this can be removed for showers,bathes and skin care. She can use the hand as tolerates. No complicating features in regards to  the hand at this juncture, this should be self-limiting. We will sign off for now, please feel free to contact if any issues arise.  Vertigo Fall Scalp hematoma Renal insufficiency   Brace Welte L 04/23/2012, 8:22 AM

## 2012-04-23 NOTE — Progress Notes (Signed)
Physical Therapy Treatment Patient Details Name: [name] MRN: [id number] DOB: 1917-02-11 Today's Date: 04/23/2012 Time: 7829-5621 PT Time Calculation (min): 32 min  PT Assessment / Plan / Recommendation Comments on Treatment Session  Reassessed pt for BPPV.  Horizontal canal was negative; Posterior canal was positive for torsional nystagmus to the Left, but to a lesser extent that 5/30.  Epply done with more "classic" signs of crystal movement.  Will recheck 6/1.    Follow Up Recommendations  Outpatient PT    Barriers to Discharge        Equipment Recommendations  Defer to next venue    Recommendations for Other Services    Frequency Min 3X/week   Plan Discharge plan remains appropriate    Precautions / Restrictions Precautions Precautions: Fall Restrictions Weight Bearing Restrictions: No   Pertinent Vitals/Pain     Mobility  Bed Mobility Bed Mobility: Rolling Left;Left Sidelying to Sit;Sitting - Scoot to Edge of Bed Rolling Right: 5: Supervision Rolling Left: 5: Supervision;With rail Left Sidelying to Sit: 5: Supervision;With rails Sitting - Scoot to Edge of Bed: 5: Supervision Details for Bed Mobility Assistance: Pt. stating "Oh I can get myself outta this bed" Transfers Transfers: Sit to Stand;Stand to Sit;Stand Pivot Transfers Sit to Stand: 4: Min guard;With upper extremity assist;From bed Stand to Sit: 4: Min guard;To chair/3-in-1;With upper extremity assist;With armrests Details for Transfer Assistance: vc's for hand placement Ambulation/Gait Ambulation/Gait Assistance: 4: Min assist Ambulation Distance (Feet): 15 Feet Assistive device: 1 person hand held assist Ambulation/Gait Assistance Details: mild unsteadiness just after vestibular testing Gait Pattern: Step-through pattern    Exercises     PT Diagnosis:    PT Problem List:   PT Treatment Interventions:     PT Goals Acute Rehab PT Goals Time For Goal Achievement: 04/29/12 Potential to  Achieve Goals: Good PT Goal: Supine/Side to Sit - Progress: Progressing toward goal PT Goal: Sit to Stand - Progress: Progressing toward goal Additional Goals Additional Goal #1: continue CRP until no nystamus with positional changes and intensity of dizziness<+=3/10 PT Goal: Additional Goal #1 - Progress: Progressing toward goal  Visit Information  Last PT Received On: 04/23/12 Assistance Needed: +1    Subjective Data  Subjective: It hasn't been as bad   Cognition  Overall Cognitive Status: Appears within functional limits for tasks assessed/performed Arousal/Alertness: Awake/alert Orientation Level: Oriented X4 / Intact Behavior During Session: [facility] for tasks performed Cognition - Other Comments: Pt. with decreased awareness of deficits and need for increased assistance at home    Balance     End of Session PT - End of Session Activity Tolerance: Patient tolerated treatment well Patient left: in bed;with call bell/phone within reach;with family/visitor present Nurse Communication: Mobility status    Nilton Lave, Eliseo Gum 04/23/2012, 3:40 PM  04/23/2012  [place] Wheeler Bing, PT [phone] (pager)
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
## 2012-04-23 NOTE — Progress Notes (Signed)
   CARE MANAGEMENT NOTE 04/23/2012  Patient:  [name], [name]   Account Number:  [id number]  Date Initiated:  04/21/2012  Documentation initiated by:  GRAVES-BIGELOW,BRENDA  Subjective/Objective Assessment:   Pt admitted from a fall. Currently being treated for hypertension, stage III chronic kidney disease, and vertigo.     Action/Plan:   MD placed consult for PT/OT- CM will continue ot f/u for disposition needs.   Anticipated DC Date:  04/23/2012   Anticipated DC Plan:  HOME W HOME HEALTH SERVICES      DC Planning Services  CM consult      Choice offered to / List presented to:             Status of service:  In process, will continue to follow Medicare Important Message given?   (If response is "NO", the following Medicare IM given date fields will be blank) Date Medicare IM given:   Date Additional Medicare IM given:    Discharge Disposition:    Per UR Regulation:  Reviewed for med. necessity/level of care/duration of stay  If discussed at Long Length of Stay Meetings, dates discussed:    Comments:  04/23/2012  [address] Darlyne Russian RN, CCM PT and OT recommend SNF, patient declines, she wants to go home. BP elevated today 199/56. CM to continue to follow for discharge planning.  04/22/2012  [address] RN, [place] [phone] Met with patient to discuss CM and discharge planning. She lives at home alone. DME: walker, cane Home health services with [facility]: nursing assistant 4 days/week 2hours /day 9 am to 11 am  RN one day per week. CM to continue to follow for discharge needs  [facility] [phone] spoke with [facility] regarding current inpatient status and will update with dc plans.
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
## 2012-04-23 NOTE — Progress Notes (Signed)
04/23/2012 patient blood pressure was 196/68, 90 at 1854,  md was notified and orders were given. Charday Capetillo Rn.

## 2012-04-24 DIAGNOSIS — I10 Essential (primary) hypertension: Secondary | ICD-10-CM

## 2012-04-24 DIAGNOSIS — I6523 Occlusion and stenosis of bilateral carotid arteries: Secondary | ICD-10-CM | POA: Diagnosis present

## 2012-04-24 DIAGNOSIS — R42 Dizziness and giddiness: Secondary | ICD-10-CM

## 2012-04-24 DIAGNOSIS — N189 Chronic kidney disease, unspecified: Secondary | ICD-10-CM

## 2012-04-24 DIAGNOSIS — E119 Type 2 diabetes mellitus without complications: Secondary | ICD-10-CM | POA: Diagnosis present

## 2012-04-24 DIAGNOSIS — E049 Nontoxic goiter, unspecified: Secondary | ICD-10-CM | POA: Diagnosis present

## 2012-04-24 HISTORY — DX: Dizziness and giddiness: R42

## 2012-04-24 MED ORDER — MECLIZINE HCL 12.5 MG PO TABS: 12.5000 mg | ORAL_TABLET | Freq: Three times a day (TID) | ORAL | Status: AC | PRN

## 2012-04-24 MED ORDER — ACETAMINOPHEN 325 MG PO TABS: 650.0000 mg | ORAL_TABLET | ORAL | Status: DC | PRN

## 2012-04-24 MED ORDER — AMLODIPINE BESYLATE 10 MG PO TABS: 10.0000 mg | ORAL_TABLET | Freq: Every day | ORAL | Status: DC
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
## 2012-04-24 NOTE — Progress Notes (Signed)
   CARE MANAGEMENT NOTE 04/24/2012  Patient:  [name], [name]   Account Number:  [id number]  Date Initiated:  04/21/2012  Documentation initiated by:  GRAVES-BIGELOW,BRENDA  Subjective/Objective Assessment:   Pt admitted from a fall. Currently being treated for hypertension, stage III chronic kidney disease, and vertigo.     Action/Plan:   MD placed consult for PT/OT- CM will continue ot f/u for disposition needs.   Anticipated DC Date:  04/23/2012   Anticipated DC Plan:  HOME W HOME HEALTH SERVICES      DC Planning Services  CM consult      [facility] Choice  HOME HEALTH   Choice offered to / List presented to:  C-1 Patient        HH arranged  HH-1 RN  HH-2 PT  HH-3 OT  HH-1 RN      [facility] agency  [facility]   Status of service:  Completed, signed off Medicare Important Message given?   (If response is "NO", the following Medicare IM given date fields will be blank) Date Medicare IM given:   Date Additional Medicare IM given:    Discharge Disposition:  HOME W HOME HEALTH SERVICES  Per UR Regulation:  Reviewed for med. necessity/level of care/duration of stay  If discussed at Long Length of Stay Meetings, dates discussed:    Comments:  04/24/2012 1200 Contacted [facility] to make aware of pt's scheduled d/c home today. Spoke to on call RN. Will schedule start of care visit for 6/2. Faxed orders, facesheet, F2F and d/c summary to [facility]. Isidoro Donning RN CCM Case Mgmt phone [phone]  04/23/2012  1200 Darlyne Russian RN, CCM PT and OT recommend SNF, patient declines, she wants to go home. BP elevated today 199/56. CM to continue to follow for discharge planning.  04/22/2012  [address] RN, [place] [phone] Met with patient to discuss CM and discharge planning. She lives at home alone. DME: walker, cane Home health services with [facility]: nursing assistant 4 days/week 2hours /day 9 am to 11 am  RN one day per week. CM to continue to follow for discharge  needs  [facility] [phone] spoke with [facility] regarding current inpatient status and will update with dc plans.

## 2012-04-24 NOTE — Progress Notes (Signed)
CSW provided patient with clothes to return home. CSW also set up [facility] transportation for the patient to return home.  Sabino Niemann, MSW, [employer] [phone]

## 2012-04-24 NOTE — Progress Notes (Signed)
Discussed discharge instructions and medications with patient. All questions answered. Patient up in chair awaiting ambulance for home. Steele Berg RN

## 2012-05-01 ENCOUNTER — Other Ambulatory Visit (HOSPITAL_COMMUNITY): Payer: Self-pay | Admitting: Internal Medicine

## 2012-05-16 ENCOUNTER — Emergency Department (HOSPITAL_COMMUNITY)
Admission: EM | Admit: 2012-05-16 | Discharge: 2012-05-16 | Disposition: A | Discharge status: Home / Self Care | Payer: MEDICARE | Attending: Emergency Medicine | Admitting: Emergency Medicine

## 2012-05-16 ENCOUNTER — Encounter (HOSPITAL_COMMUNITY): Payer: Self-pay | Admitting: *Deleted

## 2012-05-16 ENCOUNTER — Emergency Department (HOSPITAL_COMMUNITY): Payer: MEDICARE

## 2012-05-16 DIAGNOSIS — I10 Essential (primary) hypertension: Secondary | ICD-10-CM

## 2012-05-16 DIAGNOSIS — Z7982 Long term (current) use of aspirin: Secondary | ICD-10-CM | POA: Insufficient documentation

## 2012-05-16 DIAGNOSIS — Z79899 Other long term (current) drug therapy: Secondary | ICD-10-CM | POA: Insufficient documentation

## 2012-05-16 DIAGNOSIS — H8110 Benign paroxysmal vertigo, unspecified ear: Secondary | ICD-10-CM | POA: Insufficient documentation

## 2012-05-16 DIAGNOSIS — IMO0002 Reserved for concepts with insufficient information to code with codable children: Secondary | ICD-10-CM

## 2012-05-16 HISTORY — DX: Unspecified fall, initial encounter: W19.XXXA

## 2012-05-16 HISTORY — DX: Dizziness and giddiness: R42

## 2012-05-16 LAB — COMPREHENSIVE METABOLIC PANEL
AST: 13 U/L (ref 0–37)
CO2: 25 mEq/L (ref 19–32)
Calcium: 9.3 mg/dL (ref 8.4–10.5)
Chloride: 107 mEq/L (ref 96–112)
Creatinine, Ser: 1.58 mg/dL — ABNORMAL HIGH (ref 0.50–1.10)
GFR calc Af Amer: 31 mL/min — ABNORMAL LOW (ref >90)
GFR calc non Af Amer: 27 mL/min — ABNORMAL LOW (ref >90)
Glucose, Bld: 117 mg/dL — ABNORMAL HIGH (ref 70–99)
Total Bilirubin: 0.5 mg/dL (ref 0.3–1.2)

## 2012-05-16 LAB — URINALYSIS, ROUTINE W REFLEX MICROSCOPIC
Bilirubin Urine: NEGATIVE
Nitrite: NEGATIVE
Protein, ur: >300 mg/dL — AB
Specific Gravity, Urine: 1.013 (ref 1.005–1.030)
Urobilinogen, UA: 0.2 mg/dL (ref 0.0–1.0)

## 2012-05-16 LAB — DIFFERENTIAL
Basophils Absolute: 0 10*3/uL (ref 0.0–0.1)
Eosinophils Relative: 2 % (ref 0–5)
Lymphocytes Relative: 18 % (ref 12–46)
Lymphs Abs: 2 10*3/uL (ref 0.7–4.0)
Monocytes Absolute: 0.8 10*3/uL (ref 0.1–1.0)
Monocytes Relative: 7 % (ref 3–12)
Neutro Abs: 8.3 10*3/uL — ABNORMAL HIGH (ref 1.7–7.7)

## 2012-05-16 LAB — URINE MICROSCOPIC-ADD ON

## 2012-05-16 LAB — PROTIME-INR: Prothrombin Time: 12.9 seconds (ref 11.6–15.2)

## 2012-05-16 LAB — CBC
HCT: 35.6 % — ABNORMAL LOW (ref 36.0–46.0)
Hemoglobin: 12 g/dL (ref 12.0–15.0)
MCV: 80.9 fL (ref 78.0–100.0)
RBC: 4.4 MIL/uL (ref 3.87–5.11)
WBC: 11.3 10*3/uL — ABNORMAL HIGH (ref 4.0–10.5)

## 2012-05-16 MED ORDER — MECLIZINE HCL 12.5 MG PO TABS: 25.0000 mg | ORAL_TABLET | Freq: Three times a day (TID) | ORAL | Status: DC | PRN

## 2012-05-16 MED ORDER — HYDRALAZINE HCL 20 MG/ML IJ SOLN
10.0000 mg | INTRAMUSCULAR | Status: AC
  Administered 2012-05-16: 10 mg via INTRAVENOUS
  Filled 2012-05-16: qty 0.5

## 2012-05-16 NOTE — ED Notes (Signed)
Per EMS: Called to pt home via her life alert necklace for dizziness that made her fall back into the bed when trying to stand up this am. She has been dizzy since her fall on 04/24/12, but it is getting worse.

## 2012-05-16 NOTE — ED Provider Notes (Addendum)
History     CSN: [id number]  Arrival date & time 05/16/12  2542   First MD Initiated Contact with Patient 05/16/12 [phone]      Chief Complaint  Patient presents with  . Dizziness    fell back into bed while trying to stand up this am    (Consider location/radiation/quality/duration/timing/severity/associated sxs/prior treatment) HPI Pt recently admitted for vertigo with neg MRI and CT. Vertigo thought to be due to BPV. Pt states she was sitting up in bed this morning and the room started to spin around. She laid back down and this improved. She currently has no vertigo. She c/o of mild L occipital pain. No focal weakness or sensory changes. No CP, SOB, N/V/D, abd pain or urinary symptoms Past Medical History  Diagnosis Date  . Heart murmur   . Hypertension   . Dizziness 04/2012  . Fall     Past Surgical History  Procedure Date  . Abdominal hysterectomy     Family History  Problem Relation Age of Onset  . Uterine cancer Sister     History  Substance Use Topics  . Smoking status: Former Smoker    Types: Cigarettes    Quit date: 04/21/1969  . Smokeless tobacco: Never Used  . Alcohol Use: No    OB History    Grav Para Term Preterm Abortions TAB SAB Ect Mult Living                  Review of Systems  Constitutional: Negative for fever and chills.  HENT: Negative for neck pain.   Eyes: Positive for photophobia.  Respiratory: Negative for cough and shortness of breath.   Cardiovascular: Positive for leg swelling. Negative for chest pain.  Gastrointestinal: Negative for nausea, vomiting and abdominal pain.  Genitourinary: Negative for dysuria.  Musculoskeletal: Negative for back pain.  Skin: Negative for pallor, rash and wound.  Neurological: Positive for dizziness and headaches. Negative for syncope, weakness, light-headedness and numbness.    Allergies  Review of patient's allergies indicates no known allergies.  Home Medications   Current Outpatient Rx    Name Route Sig Dispense Refill  . AMLODIPINE BESYLATE 10 MG PO TABS Oral Take 1 tablet (10 mg total) by mouth daily. 30 tablet 0  . ASPIRIN EC 81 MG PO TBEC Oral Take 81 mg by mouth daily.    [name] OVER THE COUNTER MEDICATION Oral Take 1 tablet by mouth daily. Generic for Miralax.    [name] OVER THE COUNTER MEDICATION Oral Take 500 mg by mouth every 6 (six) hours as needed. [facility] Pain Relief 500mg  Gel Capsule. As needed for pain.    [name] MECLIZINE HCL 12.5 MG PO TABS Oral Take 2 tablets (25 mg total) by mouth 3 (three) times daily as needed. For dizziness 30 tablet 0    BP 183/58  Pulse 85  Temp 98.8 F (37.1 C) (Oral)  Resp 20  SpO2 96%  Physical Exam  Nursing note and vitals reviewed. Constitutional: She is oriented to person, place, and time. She appears well-developed and well-nourished. No distress.  HENT:  Head: Normocephalic and atraumatic.  Mouth/Throat: Oropharynx is clear and moist.  Eyes: EOM are normal. Pupils are equal, round, and reactive to light.       No nystagmus  Neck: Normal range of motion. Neck supple.       No meningismus   Cardiovascular: Normal rate and regular rhythm.   Murmur heard. Pulmonary/Chest: Effort normal and breath sounds normal. No respiratory distress.  She has no wheezes. She has no rales.  Abdominal: Soft. Bowel sounds are normal. She exhibits no mass. There is no tenderness. There is no rebound and no guarding.  Musculoskeletal: Normal range of motion. She exhibits tenderness (R hand hematoma). She exhibits no edema.  Neurological: She is alert and oriented to person, place, and time.       5/5 motor, sensation intact, finger to nose intact  Skin: Skin is warm and dry. No rash noted. No erythema.  Psychiatric: She has a normal mood and affect. Her behavior is normal.    ED Course  Procedures (including critical care time)  Labs Reviewed  CBC - Abnormal; Notable for the following:    WBC 11.3 (*)     HCT 35.6 (*)     All other components within  normal limits  DIFFERENTIAL - Abnormal; Notable for the following:    Neutro Abs 8.3 (*)     All other components within normal limits  COMPREHENSIVE METABOLIC PANEL - Abnormal; Notable for the following:    Glucose, Bld 117 (*)     Creatinine, Ser 1.58 (*)     Albumin 3.1 (*)     GFR calc non Af Amer 27 (*)     GFR calc Af Amer 31 (*)     All other components within normal limits  URINALYSIS, ROUTINE W REFLEX MICROSCOPIC - Abnormal; Notable for the following:    Hgb urine dipstick TRACE (*)     Protein, ur >300 (*)     All other components within normal limits  URINE MICROSCOPIC-ADD ON - Abnormal; Notable for the following:    Bacteria, UA FEW (*)     All other components within normal limits  PROTIME-INR  APTT   Ct Head Wo Contrast  05/16/2012  *RADIOLOGY REPORT*  Clinical Data: Headache.  Dizziness.  CT HEAD WITHOUT CONTRAST  Technique:  Contiguous axial images were obtained from the base of the skull through the vertex without contrast.  Comparison: 04/21/2012  Findings: Chronic ischemic changes in the periventricular white matter.  No mass effect, midline shift, or acute intracranial hemorrhage.  Fluid-filled sella turcica is stable.  Cranium is intact.  Mastoid air cells are clear.  IMPRESSION: No acute intracranial pathology.  Original Report Authenticated By: Donavan Burnet, M.D.     1. Positional vertigo   2. Hypertension      Date: 07/13/2012  Rate: 61  Rhythm: normal sinus rhythm  QRS Axis: normal  Intervals: PR prolonged  ST/T Wave abnormalities: nonspecific T wave changes  Conduction Disutrbances:first-degree A-V block   Narrative Interpretation:   Old EKG Reviewed: unchanged Inverted T waves in inferior and lat leads present on previous EKG   MDM  Suspect recurrence of her positional vertigo.  Pt is feeling much better. BP elevated but states she forgot to take her BP med this AM. She has not been taking her antivert and was confused when to use it. Will give  RX for antivert and ENT f/u. Return for concerns     Loren Racer, MD 05/16/12 1456  Loren Racer, MD 07/13/12 [phone]

## 2012-05-16 NOTE — ED Notes (Signed)
NWG:[id number]<ZH> Expected date:05/16/12<BR> Expected time: 9:19 AM<BR> Means of arrival:Ambulance<BR> Comments:<BR> Dizziness, 230/100 now 210/99

## 2012-05-16 NOTE — ED Notes (Signed)
MD at bedside. 

## 2012-05-16 NOTE — Discharge Instructions (Signed)
Vertigo Vertigo means you feel like you or your surroundings are moving when they are not. Vertigo can be dangerous if it occurs when you are at work, driving, or performing difficult activities.  CAUSES  Vertigo occurs when there is a conflict of signals sent to your brain from the visual and sensory systems in your body. There are many different causes of vertigo, including:  Infections, especially in the inner ear.   A bad reaction to a drug or misuse of alcohol and medicines.   Withdrawal from drugs or alcohol.   Rapidly changing positions, such as lying down or rolling over in bed.   A migraine headache.   Decreased blood flow to the brain.   Increased pressure in the brain from a head injury, infection, tumor, or bleeding.  SYMPTOMS  You may feel as though the world is spinning around or you are falling to the ground. Because your balance is upset, vertigo can cause nausea and vomiting. You may have involuntary eye movements (nystagmus). DIAGNOSIS  Vertigo is usually diagnosed by physical exam. If the cause of your vertigo is unknown, your caregiver may perform imaging tests, such as an MRI scan (magnetic resonance imaging). TREATMENT  Most cases of vertigo resolve on their own, without treatment. Depending on the cause, your caregiver may prescribe certain medicines. If your vertigo is related to body position issues, your caregiver may recommend movements or procedures to correct the problem. In rare cases, if your vertigo is caused by certain inner ear problems, you may need surgery. HOME CARE INSTRUCTIONS   Follow your caregiver's instructions.   Avoid driving.   Avoid operating heavy machinery.   Avoid performing any tasks that would be dangerous to you or others during a vertigo episode.   Tell your caregiver if you notice that certain medicines seem to be causing your vertigo. Some of the medicines used to treat vertigo episodes can actually make them worse in some  people.  SEEK IMMEDIATE MEDICAL CARE IF:   Your medicines do not relieve your vertigo or are making it worse.   You develop problems with talking, walking, weakness, or using your arms, hands, or legs.   You develop severe headaches.   Your nausea or vomiting continues or gets worse.   You develop visual changes.   A family member notices behavioral changes.   Your condition gets worse.  MAKE SURE YOU:  Understand these instructions.   Will watch your condition.   Will get help right away if you are not doing well or get worse.  Document Released: 08/20/2005 Document Revised: 10/30/2011 Document Reviewed: 05/29/2011 [employer] Patient Information 2012 [employer]. 

## 2012-05-16 NOTE — ED Notes (Addendum)
Water provided to the patient per pt patient request.

## 2012-06-22 ENCOUNTER — Other Ambulatory Visit (HOSPITAL_COMMUNITY): Payer: Self-pay | Admitting: Internal Medicine

## 2013-11-17 IMAGING — CR DG KNEE 1-2V*R*
2 series · 2 of 2 positions shown · non-contrast
Comparison: None

CLINICAL DATA: Knee pain after dizziness and fall.

RIGHT KNEE - 1-2 VIEW

[t knee ap right]
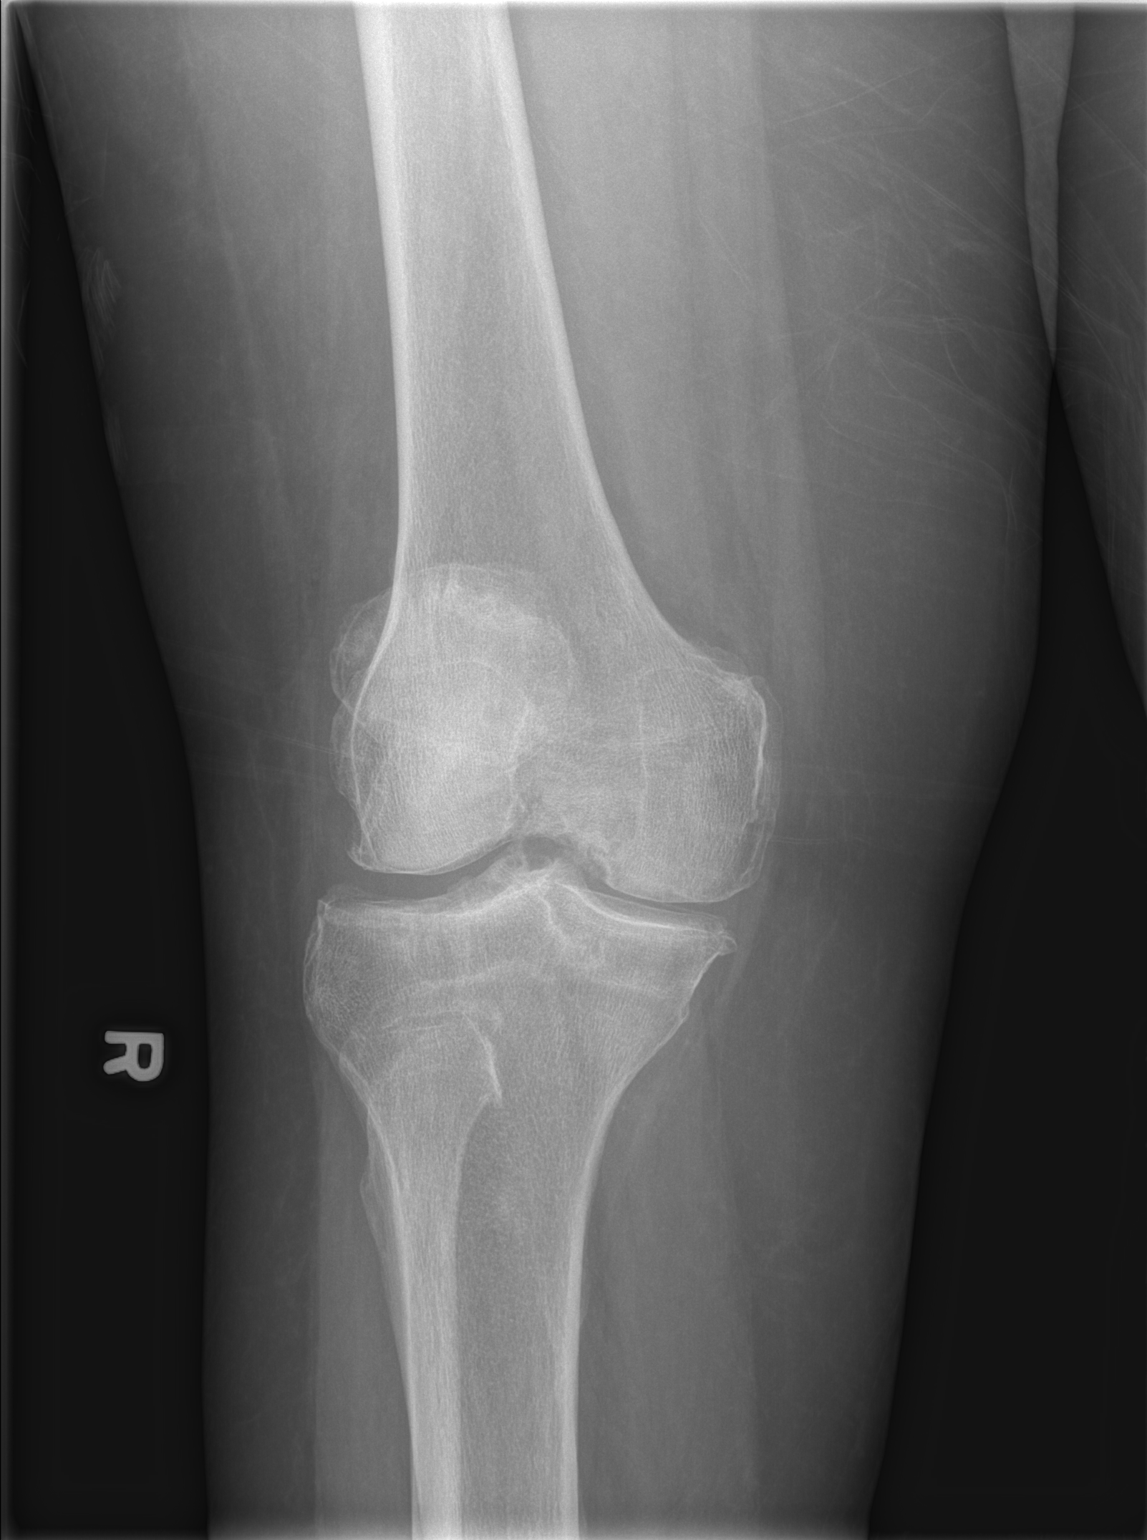

[x knee lat right]
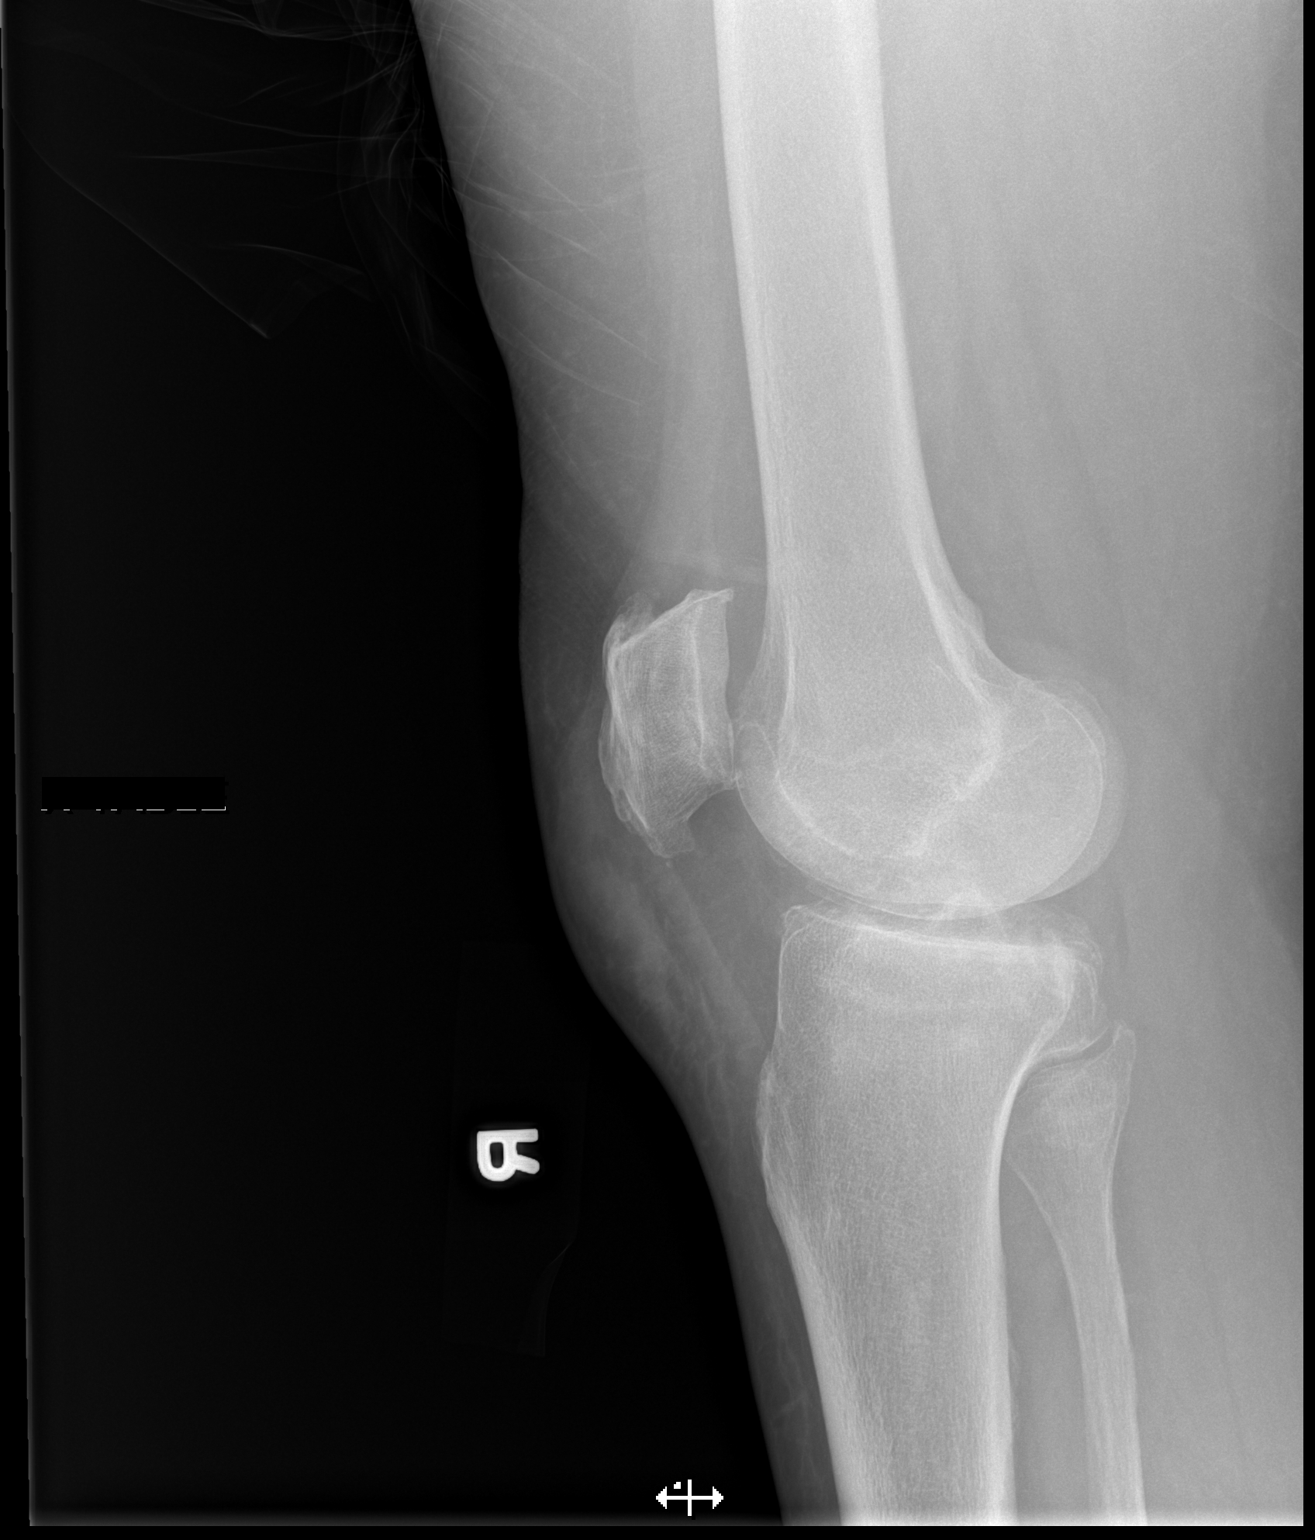

[2 of 2 positions shown; findings below may reference images not displayed]

FINDINGS: Prominent tricompartmental degenerative changes in the
right knee with compartment narrowing and hypertrophic changes.
Slight irregularity of the posterior tibial plateau which is
probably due to degenerative change but nondisplaced impaction
fracture is not excluded.  No significant effusion.  Soft tissue
swelling/hematoma over the inferior patellar ligament.
IMPRESSION: Prominent degenerative changes in the right knee.  Subcutaneous
soft tissue hematoma anterior to the inferior patellar ligament.
Sclerosis and irregularity of the tibial plateau is probably due to
degenerative change but impacted fractures not excluded.

## 2013-11-17 IMAGING — CR DG LUMBAR SPINE COMPLETE 4+V
6 series · 6 of 6 positions shown · non-contrast
Comparison: Abdomen 04/03/2005

CLINICAL DATA: Back pain after dizziness and fall.

LUMBAR SPINE - COMPLETE 4+ VIEW

[t lumbar spine ap]
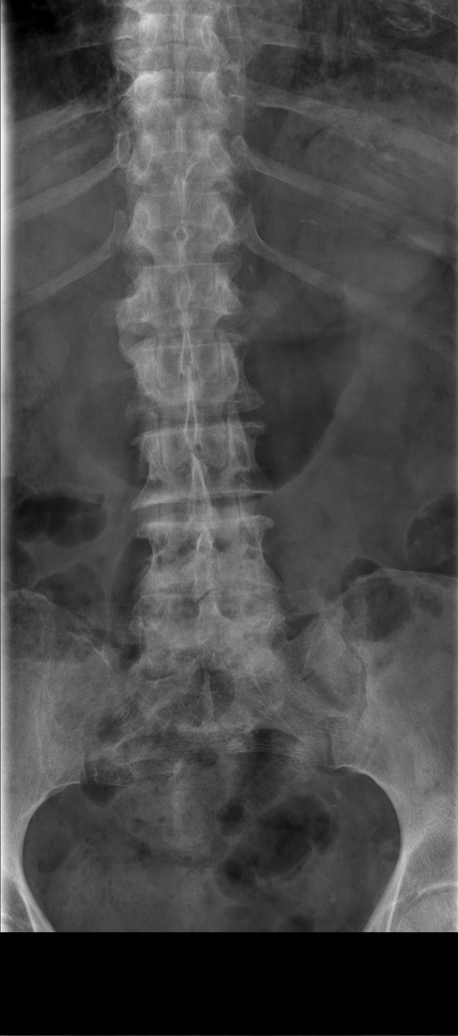

[t lumbar spine obl (1 of 3)]
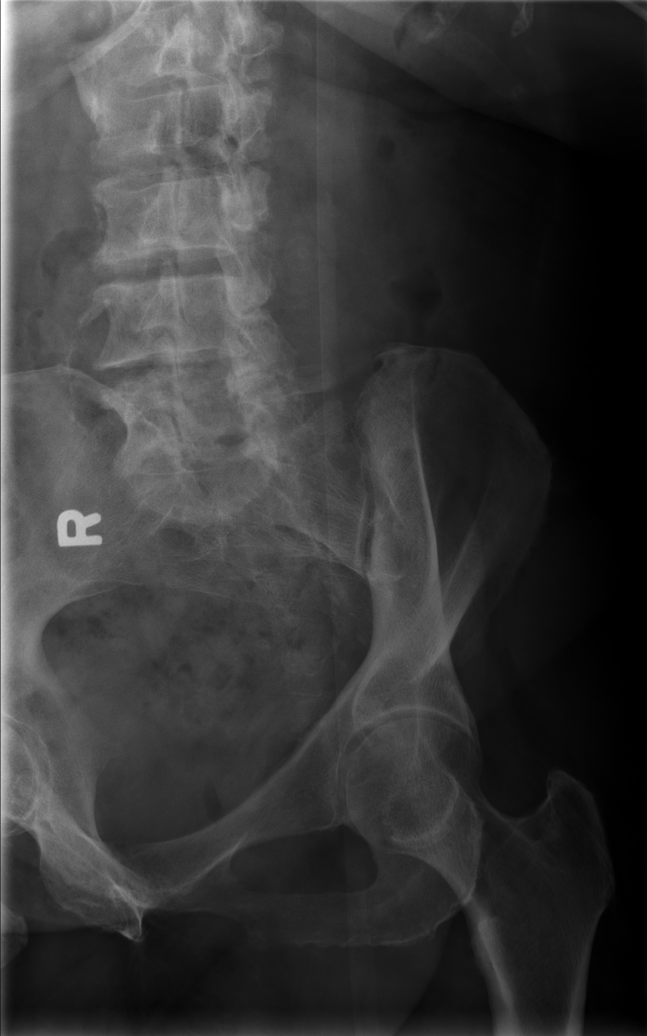

[t lumbar spine obl (2 of 3)]
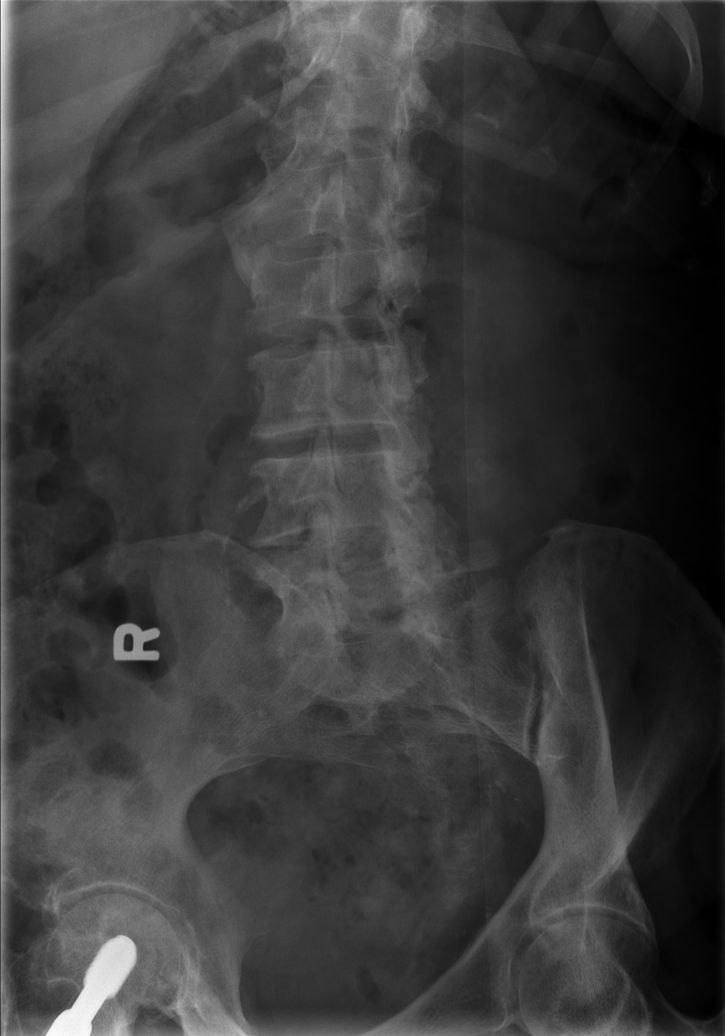

[t lumbar spine obl (3 of 3)]
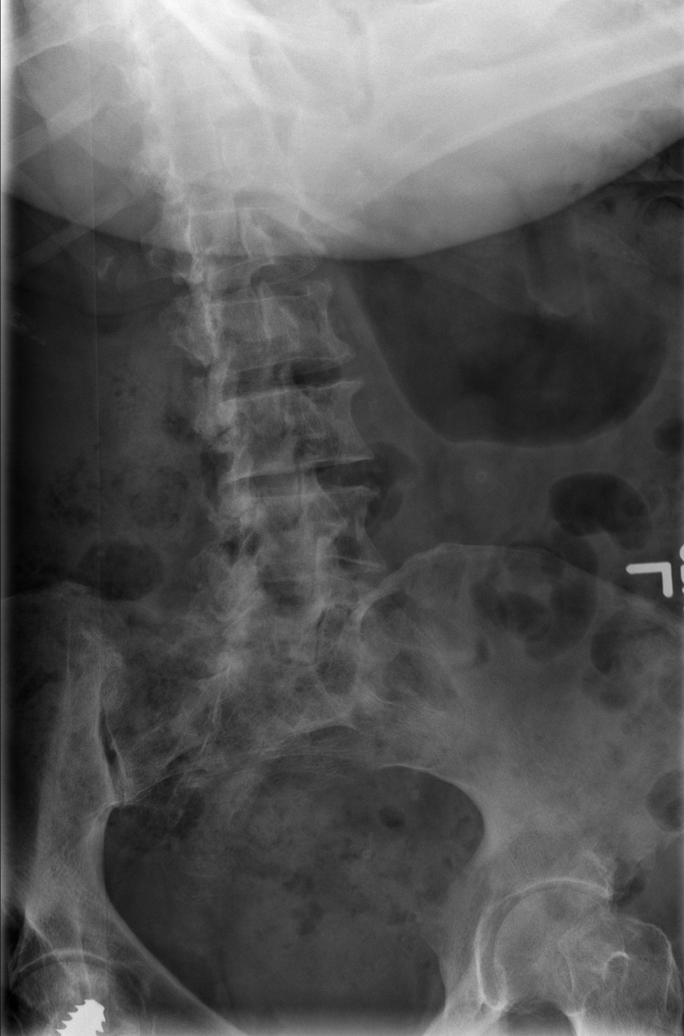

[t lumbar spine lat]
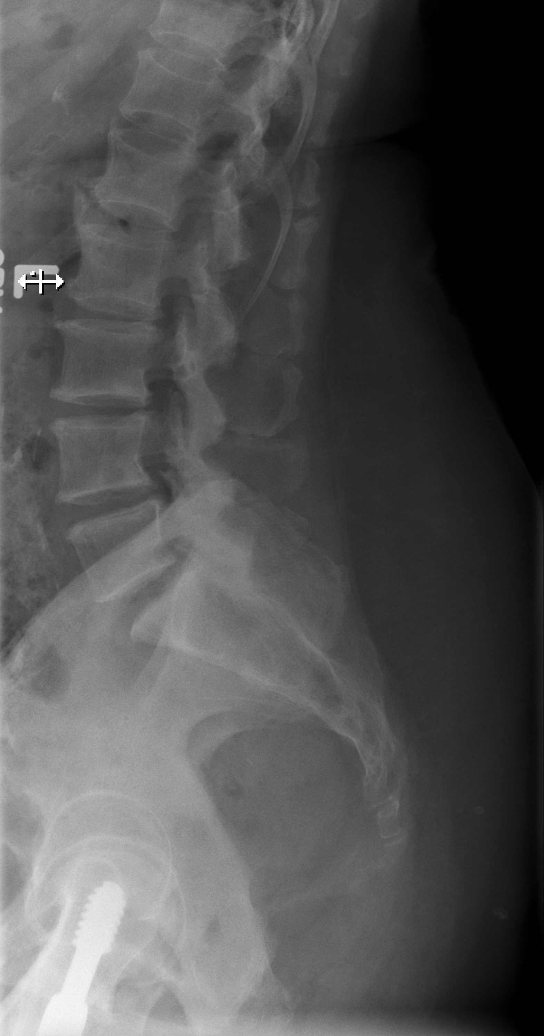

[t lumbar l-5 s-1 spot]
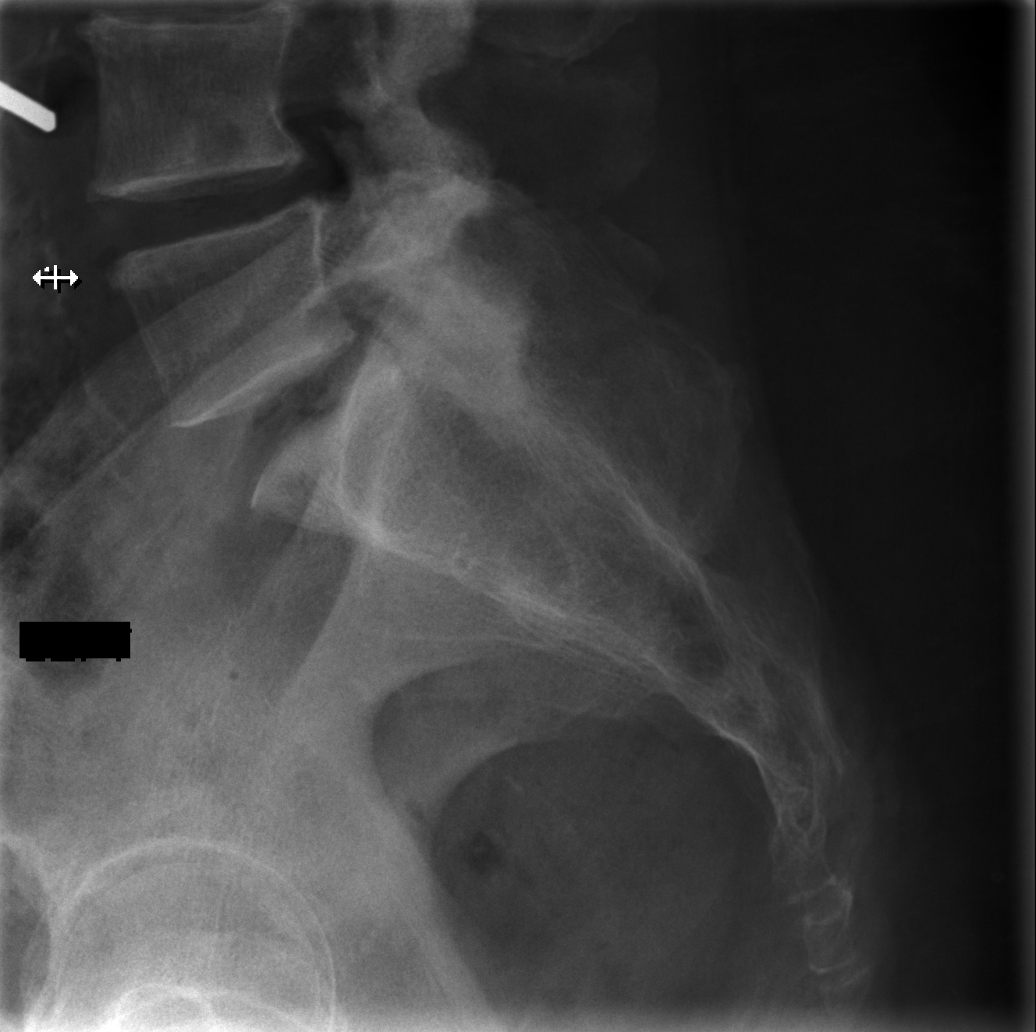

[6 of 6 positions shown; findings below may reference images not displayed]

FINDINGS: Five lumbar type vertebra.  Normal alignment of the
lumbar vertebrae and facet joints.  Diffuse degenerative changes
with narrowed lumbar interspaces and associated endplate
hypertrophic changes.  No vertebral compression deformities.  No
focal bone lesion or bone destruction.  Bone cortex and trabecular
architecture appear intact.  Postoperative change in the right hip.
Vascular calcifications.  Focal irregularity of the coccygeal spine
likely represents old fracture deformity.
IMPRESSION: Degenerative changes in the lumbar spine. No acute fractures
identified.

## 2013-11-17 IMAGING — CR DG THORACIC SPINE 2V
2 series · 2 of 2 positions shown · non-contrast
Comparison: Chest 09/11/2009.

CLINICAL DATA: Back pain after dizziness and falling.

THORACIC SPINE - 2 VIEW

[t thoracic spine ap]
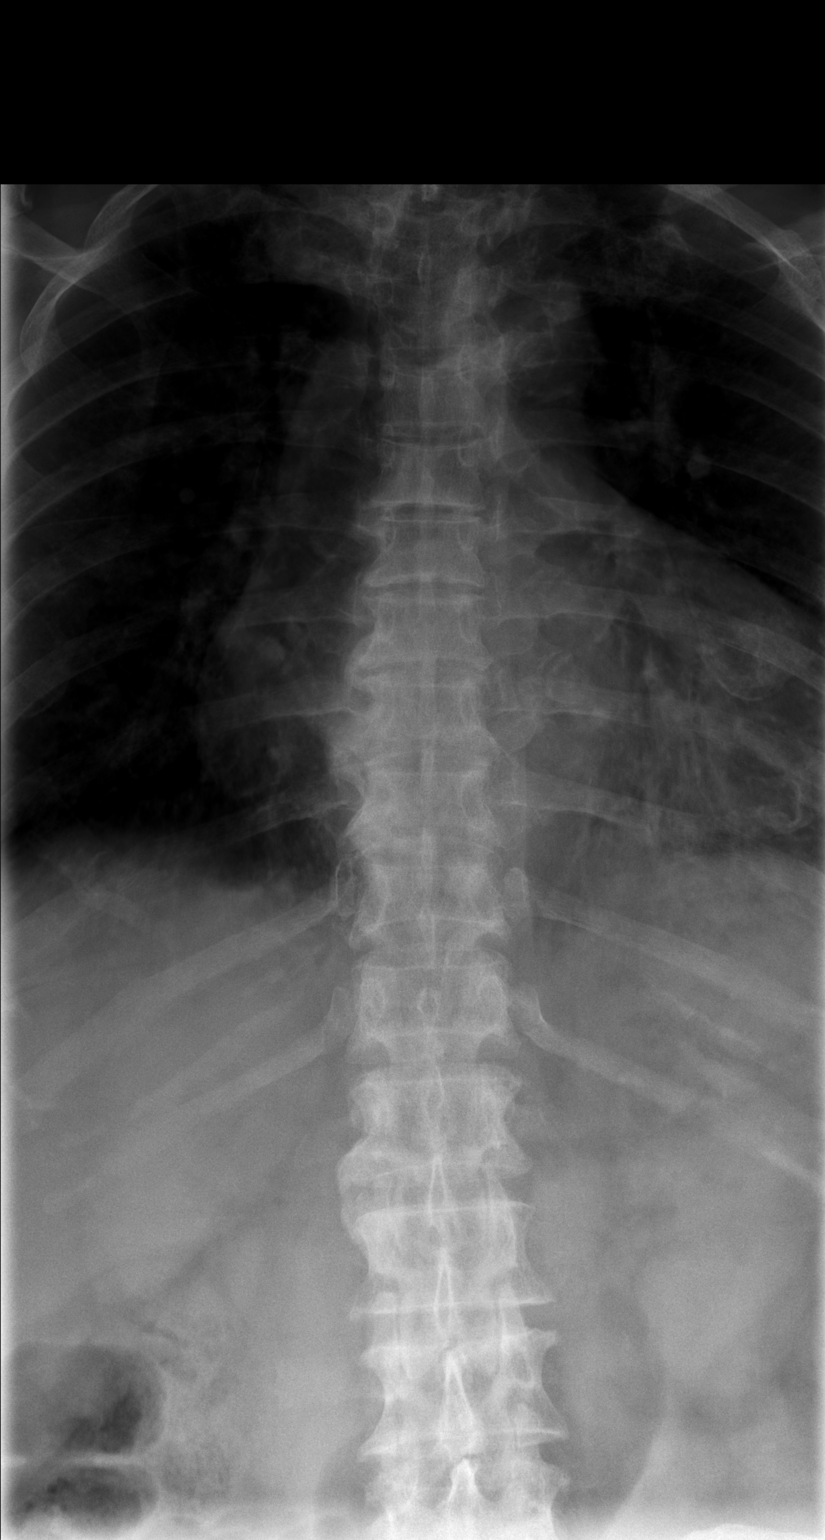

[t thoracic spine lat]
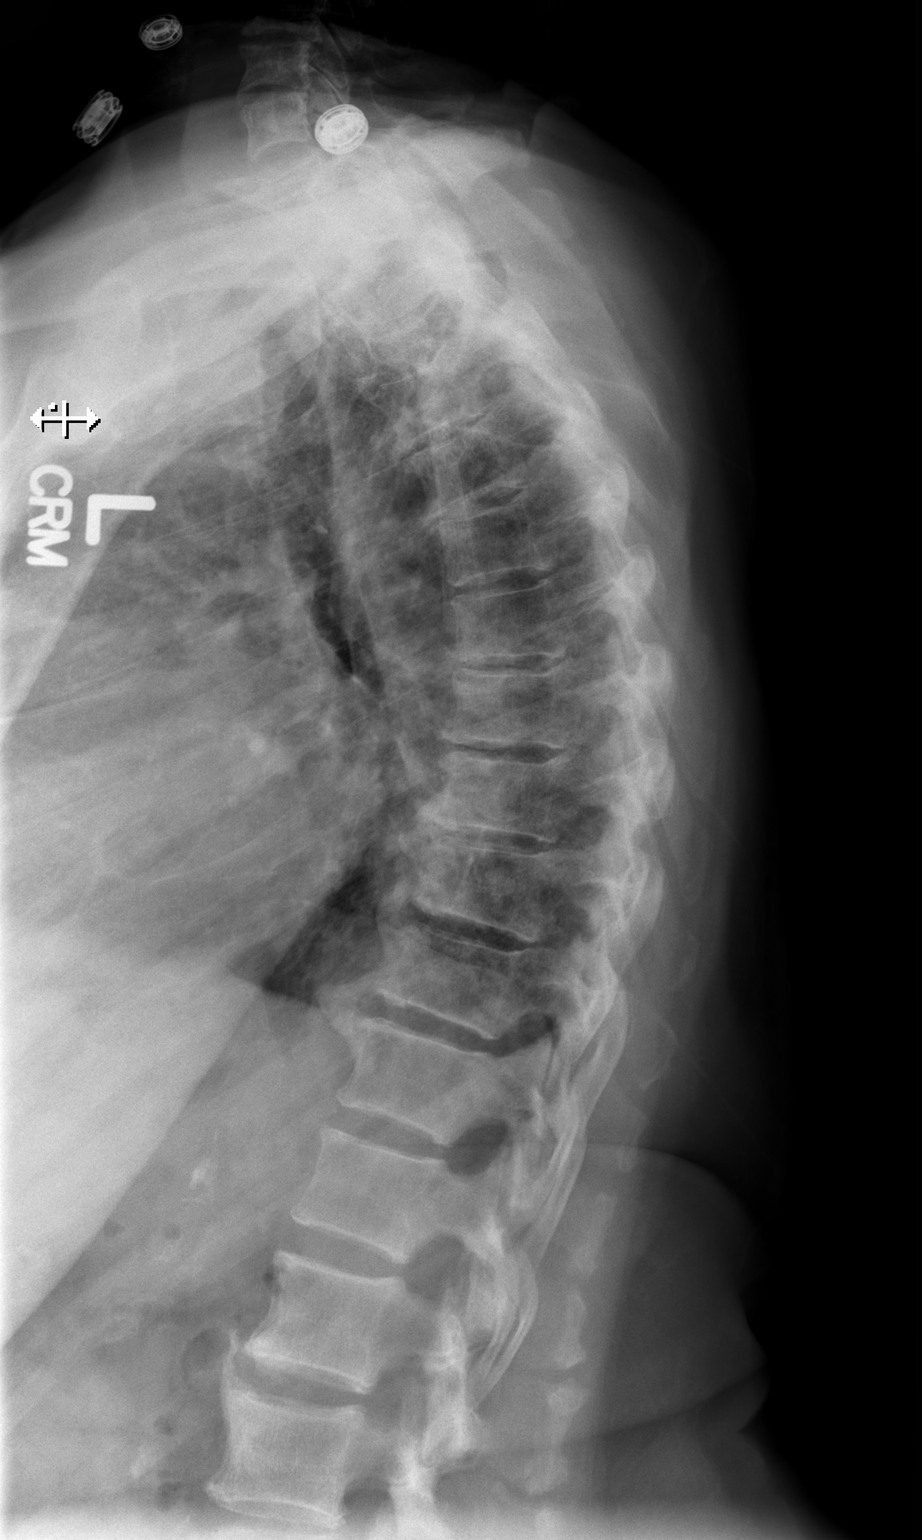

[2 of 2 positions shown; findings below may reference images not displayed]

FINDINGS: Normal alignment of the thoracic vertebra.  Degenerative
changes throughout the thoracic spine with narrowed thoracic
interspaces and endplate osteophytes.  Bridging osteophytes in the
lower thoracic region anteriorly.  No vertebral compression
deformities.  No focal bone lesion or bone destruction.  Bone
cortex and trabecular architecture appear intact.  There appears to
be infiltration or atelectasis in the left lung base.  No
paraspinal soft tissue swelling.
IMPRESSION: Degenerative changes throughout the thoracic spine.  No displaced
fractures identified.  Suggestion of infiltration or atelectasis in
the left lung base.

## 2014-01-12 ENCOUNTER — Inpatient Hospital Stay (HOSPITAL_COMMUNITY)
Admission: EM | Admit: 2014-01-12 | Discharge: 2014-01-16 | DRG: 682 | Disposition: A | Discharge status: Skilled Nursing Facility | Payer: Medicare Other | Attending: Internal Medicine | Admitting: Internal Medicine

## 2014-01-12 ENCOUNTER — Encounter (HOSPITAL_COMMUNITY): Payer: Self-pay | Admitting: Emergency Medicine

## 2014-01-12 ENCOUNTER — Emergency Department (HOSPITAL_COMMUNITY): Payer: Medicare Other

## 2014-01-12 DIAGNOSIS — R42 Dizziness and giddiness: Secondary | ICD-10-CM

## 2014-01-12 DIAGNOSIS — I10 Essential (primary) hypertension: Secondary | ICD-10-CM

## 2014-01-12 DIAGNOSIS — N189 Chronic kidney disease, unspecified: Secondary | ICD-10-CM | POA: Diagnosis present

## 2014-01-12 DIAGNOSIS — E119 Type 2 diabetes mellitus without complications: Secondary | ICD-10-CM

## 2014-01-12 DIAGNOSIS — Z7982 Long term (current) use of aspirin: Secondary | ICD-10-CM

## 2014-01-12 DIAGNOSIS — Z8049 Family history of malignant neoplasm of other genital organs: Secondary | ICD-10-CM

## 2014-01-12 DIAGNOSIS — I6523 Occlusion and stenosis of bilateral carotid arteries: Secondary | ICD-10-CM

## 2014-01-12 DIAGNOSIS — Z79899 Other long term (current) drug therapy: Secondary | ICD-10-CM

## 2014-01-12 DIAGNOSIS — F0390 Unspecified dementia without behavioral disturbance: Secondary | ICD-10-CM | POA: Diagnosis present

## 2014-01-12 DIAGNOSIS — F29 Unspecified psychosis not due to a substance or known physiological condition: Secondary | ICD-10-CM

## 2014-01-12 DIAGNOSIS — D649 Anemia, unspecified: Secondary | ICD-10-CM

## 2014-01-12 DIAGNOSIS — N179 Acute kidney failure, unspecified: Secondary | ICD-10-CM | POA: Diagnosis present

## 2014-01-12 DIAGNOSIS — N289 Disorder of kidney and ureter, unspecified: Secondary | ICD-10-CM

## 2014-01-12 DIAGNOSIS — W19XXXA Unspecified fall, initial encounter: Secondary | ICD-10-CM

## 2014-01-12 DIAGNOSIS — D72829 Elevated white blood cell count, unspecified: Secondary | ICD-10-CM | POA: Diagnosis present

## 2014-01-12 DIAGNOSIS — G9340 Encephalopathy, unspecified: Secondary | ICD-10-CM | POA: Diagnosis present

## 2014-01-12 DIAGNOSIS — I129 Hypertensive chronic kidney disease with stage 1 through stage 4 chronic kidney disease, or unspecified chronic kidney disease: Secondary | ICD-10-CM | POA: Diagnosis present

## 2014-01-12 DIAGNOSIS — S0003XA Contusion of scalp, initial encounter: Secondary | ICD-10-CM

## 2014-01-12 DIAGNOSIS — E860 Dehydration: Secondary | ICD-10-CM | POA: Diagnosis present

## 2014-01-12 DIAGNOSIS — D62 Acute posthemorrhagic anemia: Secondary | ICD-10-CM | POA: Diagnosis present

## 2014-01-12 DIAGNOSIS — Z87891 Personal history of nicotine dependence: Secondary | ICD-10-CM

## 2014-01-12 DIAGNOSIS — E049 Nontoxic goiter, unspecified: Secondary | ICD-10-CM

## 2014-01-12 DIAGNOSIS — K922 Gastrointestinal hemorrhage, unspecified: Secondary | ICD-10-CM | POA: Diagnosis present

## 2014-01-12 LAB — CBC WITH DIFFERENTIAL/PLATELET
BASOS ABS: 0 10*3/uL (ref 0.0–0.1)
BASOS PCT: 0 % (ref 0–1)
EOS ABS: 0.1 10*3/uL (ref 0.0–0.7)
EOS PCT: 1 % (ref 0–5)
HCT: 26.2 % — ABNORMAL LOW (ref 36.0–46.0)
Hemoglobin: 8.8 g/dL — ABNORMAL LOW (ref 12.0–15.0)
Lymphocytes Relative: 11 % — ABNORMAL LOW (ref 12–46)
Lymphs Abs: 1.4 10*3/uL (ref 0.7–4.0)
MCH: 27.1 pg (ref 26.0–34.0)
MCHC: 33.6 g/dL (ref 30.0–36.0)
MCV: 80.6 fL (ref 78.0–100.0)
Monocytes Absolute: 1 10*3/uL (ref 0.1–1.0)
Monocytes Relative: 7 % (ref 3–12)
Neutro Abs: 10.6 10*3/uL — ABNORMAL HIGH (ref 1.7–7.7)
Neutrophils Relative %: 81 % — ABNORMAL HIGH (ref 43–77)
PLATELETS: 445 10*3/uL — AB (ref 150–400)
RBC: 3.25 MIL/uL — ABNORMAL LOW (ref 3.87–5.11)
RDW: 15.5 % (ref 11.5–15.5)
WBC: 13.1 10*3/uL — AB (ref 4.0–10.5)

## 2014-01-12 LAB — COMPREHENSIVE METABOLIC PANEL
ALT: 8 U/L (ref 0–35)
AST: 16 U/L (ref 0–37)
Albumin: 3 g/dL — ABNORMAL LOW (ref 3.5–5.2)
Alkaline Phosphatase: 63 U/L (ref 39–117)
BUN: 24 mg/dL — ABNORMAL HIGH (ref 6–23)
CALCIUM: 9.3 mg/dL (ref 8.4–10.5)
CO2: 24 mEq/L (ref 19–32)
CREATININE: 2.58 mg/dL — AB (ref 0.50–1.10)
Chloride: 107 mEq/L (ref 96–112)
GFR calc Af Amer: 17 mL/min — ABNORMAL LOW (ref >90)
GFR calc non Af Amer: 15 mL/min — ABNORMAL LOW (ref >90)
Glucose, Bld: 130 mg/dL — ABNORMAL HIGH (ref 70–99)
Potassium: 4.2 mEq/L (ref 3.7–5.3)
SODIUM: 143 meq/L (ref 137–147)
TOTAL PROTEIN: 6.9 g/dL (ref 6.0–8.3)
Total Bilirubin: <0.2 mg/dL — ABNORMAL LOW (ref 0.3–1.2)

## 2014-01-12 LAB — POC OCCULT BLOOD, ED: Fecal Occult Bld: POSITIVE — AB

## 2014-01-12 LAB — URINE MICROSCOPIC-ADD ON

## 2014-01-12 LAB — URINALYSIS, ROUTINE W REFLEX MICROSCOPIC
BILIRUBIN URINE: NEGATIVE
Glucose, UA: NEGATIVE mg/dL
KETONES UR: NEGATIVE mg/dL
Leukocytes, UA: NEGATIVE
Nitrite: NEGATIVE
Specific Gravity, Urine: 1.015 (ref 1.005–1.030)
Urobilinogen, UA: 0.2 mg/dL (ref 0.0–1.0)
pH: 6 (ref 5.0–8.0)

## 2014-01-12 LAB — RETICULOCYTES
RBC.: 3.22 MIL/uL — AB (ref 3.87–5.11)
Retic Count, Absolute: 51.5 10*3/uL (ref 19.0–186.0)
Retic Ct Pct: 1.6 % (ref 0.4–3.1)

## 2014-01-12 LAB — ACETAMINOPHEN LEVEL: Acetaminophen (Tylenol), Serum: <15 ug/mL (ref 10–30)

## 2014-01-12 LAB — TROPONIN I: Troponin I: <0.3 ng/mL (ref <0.30)

## 2014-01-12 LAB — SALICYLATE LEVEL: Salicylate Lvl: <2 mg/dL — ABNORMAL LOW (ref 2.8–20.0)

## 2014-01-12 LAB — AMMONIA: Ammonia: 44 umol/L (ref 11–60)

## 2014-01-12 LAB — ETHANOL: Alcohol, Ethyl (B): <11 mg/dL (ref 0–11)

## 2014-01-12 MED ORDER — SENNA 8.6 MG PO TABS
1.0000 | ORAL_TABLET | Freq: Every day | ORAL | Status: DC
  Administered 2014-01-14 – 2014-01-16 (×3): 8.6 mg via ORAL
  Filled 2014-01-12 (×4): qty 1

## 2014-01-12 MED ORDER — SODIUM CHLORIDE 0.9 % IV SOLN: INTRAVENOUS | Status: DC

## 2014-01-12 MED ORDER — LORAZEPAM 2 MG/ML IJ SOLN
0.5000 mg | Freq: Once | INTRAMUSCULAR | Status: AC
  Administered 2014-01-12: 0.5 mg via INTRAMUSCULAR
  Filled 2014-01-12: qty 1

## 2014-01-12 MED ORDER — ONDANSETRON HCL 4 MG PO TABS: 4.0000 mg | ORAL_TABLET | Freq: Four times a day (QID) | ORAL | Status: DC | PRN

## 2014-01-12 MED ORDER — ACETAMINOPHEN 650 MG RE SUPP: 650.0000 mg | Freq: Four times a day (QID) | RECTAL | Status: DC | PRN

## 2014-01-12 MED ORDER — ONDANSETRON HCL 4 MG/2ML IJ SOLN: 4.0000 mg | Freq: Four times a day (QID) | INTRAMUSCULAR | Status: DC | PRN

## 2014-01-12 MED ORDER — ASPIRIN EC 81 MG PO TBEC
81.0000 mg | DELAYED_RELEASE_TABLET | Freq: Every day | ORAL | Status: DC
  Administered 2014-01-14 – 2014-01-16 (×2): 81 mg via ORAL
  Filled 2014-01-12 (×4): qty 1

## 2014-01-12 MED ORDER — ACETAMINOPHEN 325 MG PO TABS: 650.0000 mg | ORAL_TABLET | Freq: Four times a day (QID) | ORAL | Status: DC | PRN

## 2014-01-12 NOTE — H&P (Signed)
[facility] History and Physical  Danice Dippolito WUJ:[id number] DOB: 1917-05-04 DOA: 01/12/2014  Referring physician: Dr Gwendolyn Grant PCP: Marletta Lor, NP   Chief Complaint: confusion.   HPI: [name] is a 80 y.o. female with PMH significant for Hypertension who presents with AMS. Per report patient called multiple times to EMS today without clear reason.  When EMS  arrived at her home, however, she refuses to get in the ambulance.  She will not tell them why she called. Patient brought to the ER by police for evaluation of involuntary commitment. Patient has been notice to be confuse and agitated, per ED record this is something new for patient.  On my evaluation patient is oriented to place and person. She is not oriented to situation. She has pressure speech. Goes from one topic to another.  She has not been eating well. She denies diarrhea, chest pain or dyspnea.  On evaluation she was found to have Cr at 2.5 from prior at 1.5. She has low hb at 8.8. ED physician performed rectal exam which was negative for bright red blood. She was guaiac positive per ED physician.     Review of Systems:  Negative except as per HPI.   Past Medical History  Diagnosis Date  . Heart murmur   . Hypertension   . Dizziness 04/2012  . Fall    Past Surgical History  Procedure Laterality Date  . Abdominal hysterectomy    . Leg surgery      pt states has a rod in right leg   Social History:  reports that she quit smoking about 44 years ago. Her smoking use included Cigarettes. She smoked 0.00 packs per day. She has never used smokeless tobacco. She reports that she does not drink alcohol or use illicit drugs.  No Known Allergies  Family History  Problem Relation Age of Onset  . Uterine cancer Sister      Prior to Admission medications   Medication Sig Start Date End Date Taking? Authorizing Provider  aspirin EC 81 MG tablet Take 81 mg by mouth daily.   Yes Historical Provider, MD  senna  (SENOKOT) 8.6 MG TABS tablet Take 1 tablet by mouth daily.   Yes Historical Provider, MD   Physical Exam: Filed Vitals:   01/12/14 1402  BP: 184/93  Pulse: 85  Temp: 97.9 F (36.6 C)  Resp: 20    BP 184/93  Pulse 85  Temp(Src) 97.9 F (36.6 C) (Axillary)  Resp 20  SpO2 95%  General:  Appears comfortable, pressure speech.  Eyes: PERRL, normal lids, irises & conjunctiva ENT: grossly normal hearing.  lips & tongue dry.  Neck: no LAD, masses or thyromegaly Cardiovascular: RRR, no m/r/g. No LE edema. Telemetry: SR, no arrhythmias  Respiratory: CTA bilaterally, no w/r/r. Normal respiratory effort. Abdomen: soft, ntnd Skin: no rash or induration seen on limited exam Musculoskeletal: grossly normal tone BUE/BLE Psychiatric: pressure speech.  Neurologic: grossly non-focal.          Labs on Admission:  Basic Metabolic Panel:  Recent Labs Lab 01/12/14 1533  NA 143  K 4.2  CL 107  CO2 24  GLUCOSE 130*  BUN 24*  CREATININE 2.58*  CALCIUM 9.3   Liver Function Tests:  Recent Labs Lab 01/12/14 1533  AST 16  ALT 8  ALKPHOS 63  BILITOT <0.2*  PROT 6.9  ALBUMIN 3.0*   No results found for this basename: LIPASE, AMYLASE,  in the last 168 hours  Recent Labs Lab 01/12/14  1533  AMMONIA 44   CBC:  Recent Labs Lab 01/12/14 1533  WBC 13.1*  NEUTROABS 10.6*  HGB 8.8*  HCT 26.2*  MCV 80.6  PLT 445*   Cardiac Enzymes:  Recent Labs Lab 01/12/14 1533  TROPONINI <0.30    BNP (last 3 results) No results found for this basename: PROBNP,  in the last 8760 hours CBG: No results found for this basename: GLUCAP,  in the last 168 hours  Radiological Exams on Admission: Dg Chest 2 View  01/12/2014   CLINICAL DATA:  Hypertension.  EXAM: CHEST  2 VIEW  COMPARISON:  September 11, 2009.  FINDINGS: Stable cardiomegaly. Mild degenerative changes are noted in lower thoracic spine. No pneumothorax or significant pleural effusion is noted. No acute pulmonary disease is  noted. Degenerative changes are seen involving both glenohumeral joints. There is evidence of rotator cuff injury involving both shoulders as well.  IMPRESSION: No acute cardiopulmonary abnormality seen.   Electronically Signed   By: Roque LiasJames  Green M.D.   On: 01/12/2014 15:34   Ct Head Wo Contrast  01/12/2014   CLINICAL DATA:  Mental status changes.  EXAM: CT HEAD WITHOUT CONTRAST  TECHNIQUE: Contiguous axial images were obtained from the base of the skull through the vertex without intravenous contrast.  COMPARISON:  CT head without contrast 05/16/2012. MRI brain 04/21/2012.  FINDINGS: Atrophy and diffuse white matter disease is stable bilaterally. A remote infarct of the right occipital lobe is again noted. No acute cortical infarct, hemorrhage, or mass lesion is present. There is no significant extra-axial fluid collection.  The paranasal sinuses and mastoid air cells are clear. The osseous skull is intact. Atherosclerotic calcifications are present within the cavernous carotid arteries.  IMPRESSION: 1. Stable atrophy and diffuse white matter disease. 2. And no acute intracranial abnormality. 3. Remote infarct of the right occipital lobe.   Electronically Signed   By: Gennette Pachris  Mattern M.D.   On: 01/12/2014 15:25    EKG: Independently reviewed. Sinus Rhythm.   Assessment/Plan Active Problems:   Acute on chronic renal failure  1-Acute on chronic Renal failure; Patient appear to be dehydrated. Last cr per records was at 1.5 on 2013. Cr today at 2.5. I will start IV fluids. Avoid nephrotoxic. Repeat renal function in am. If not improvement in renal function will consider renal US.   2-Leukocytosis; chest x ray negative. UA negative. Will order urine culture. Repeat labs in am.   3-Encephalopathy, AMS; Patient notice to be confuse, agitated at times. Continue with Recruitment consultantsafety sitter. Check MRI brain. Vitamin B-12, TSH, RPR. Depending on work up result might need psych evaluation. Ammonia level; at 44. Tylenol  less than 15,   4-Anemia; no evidence of active bleed. Check anemia panel. Follow hb trend. She was guaiac positive. Suspect chronic slow bleed.      Code Status: will need to discussed with POA. Unclear who is POA.  Family Communication: none at bedside.  Disposition Plan: expect 2 to 3 days inpatient.   Time spent: 75 minutes.   [facility] CenterREGALADO,Estiben Mizuno [facility] Pager [phone]

## 2014-01-12 NOTE — ED Notes (Signed)
Patient transported to CT and now back to room.

## 2014-01-12 NOTE — ED Notes (Signed)
Lab has called and stated the labs are needing to be recollected b/c they were incorrectly run.  They will reorder all labs which will need to be recollected so disregard the results posted.

## 2014-01-12 NOTE — ED Provider Notes (Signed)
CSN: [id number]     Arrival date & time 01/12/14  1340 History   First MD Initiated Contact with Patient 01/12/14 1341     Chief Complaint  Patient presents with  . Medical Clearance     (Consider location/radiation/quality/duration/timing/severity/associated sxs/prior Treatment) HPI Comments: Patient brought to the ER by police for evaluation of involuntary commitment. The patient lives alone. She has a church member who helps her with many of her activities and also has a Child psychotherapist assigned to her. The church member noted that she was confused today. Patient has apparently called EMS once yesterday and 3 times today. When they arrived at her home, however, she refuses to get an ambulance he will not tell them why she called. Patient's caregiver reports that this is a significant change for her. She has been very agitated and confused. It is unknown if there has been any recent illness.  Level V Caveat due to confusion.   Past Medical History  Diagnosis Date  . Heart murmur   . Hypertension   . Dizziness 04/2012  . Fall    Past Surgical History  Procedure Laterality Date  . Abdominal hysterectomy    . Leg surgery      pt states has a rod in right leg   Family History  Problem Relation Age of Onset  . Uterine cancer Sister    History  Substance Use Topics  . Smoking status: Former Smoker    Types: Cigarettes    Quit date: 04/21/1969  . Smokeless tobacco: Never Used  . Alcohol Use: No   OB History   Grav Para Term Preterm Abortions TAB SAB Ect Mult Living                 Review of Systems  Unable to perform ROS: Mental status change      Allergies  Review of patient's allergies indicates no known allergies.  Home Medications   No current outpatient prescriptions on file. BP 185/63  Pulse 87  Temp(Src) 97.9 F (36.6 C) (Axillary)  Resp 16  SpO2 100% Physical Exam  Constitutional: She appears well-developed and well-nourished. No distress.  HENT:   Head: Normocephalic and atraumatic.  Right Ear: Hearing normal.  Left Ear: Hearing normal.  Nose: Nose normal.  Mouth/Throat: Oropharynx is clear and moist and mucous membranes are normal.  Eyes: Conjunctivae and EOM are normal. Pupils are equal, round, and reactive to light.  Neck: Normal range of motion. Neck supple.  Cardiovascular: Regular rhythm, S1 normal and S2 normal.  Exam reveals no gallop and no friction rub.   No murmur heard. Pulmonary/Chest: Effort normal and breath sounds normal. No respiratory distress. She exhibits no tenderness.  Abdominal: Soft. Normal appearance and bowel sounds are normal. There is no hepatosplenomegaly. There is no tenderness. There is no rebound, no guarding, no tenderness at McBurney's point and negative Murphy's sign. No hernia.  Musculoskeletal: Normal range of motion.  Neurological: She is alert. She has normal strength. She is disoriented. No cranial nerve deficit or sensory deficit. Coordination normal. GCS eye subscore is 4. GCS verbal subscore is 4. GCS motor subscore is 6.  Patient does not have any focal deficits. She answers questions and follows commands, but is clearly confused.  Strength is 5 out of 5 and symmetric in all extremities  Skin: Skin is warm, dry and intact. No rash noted. No cyanosis.  Psychiatric: She has a normal mood and affect. Her speech is normal and behavior is normal. Thought  content normal.    ED Course  Procedures (including critical care time) Labs Review Labs Reviewed  CBC WITH DIFFERENTIAL - Abnormal; Notable for the following:    WBC 13.1 (*)    RBC 3.25 (*)    Hemoglobin 8.8 (*)    HCT 26.2 (*)    Platelets 445 (*)    Neutrophils Relative % 81 (*)    Neutro Abs 10.6 (*)    Lymphocytes Relative 11 (*)    All other components within normal limits  COMPREHENSIVE METABOLIC PANEL - Abnormal; Notable for the following:    Glucose, Bld 130 (*)    BUN 24 (*)    Creatinine, Ser 2.58 (*)    Albumin 3.0 (*)     Total Bilirubin <0.2 (*)    GFR calc non Af Amer 15 (*)    GFR calc Af Amer 17 (*)    All other components within normal limits  URINALYSIS, ROUTINE W REFLEX MICROSCOPIC - Abnormal; Notable for the following:    Hgb urine dipstick SMALL (*)    Protein, ur >300 (*)    All other components within normal limits  SALICYLATE LEVEL - Abnormal; Notable for the following:    Salicylate Lvl <2.0 (*)    All other components within normal limits  IRON AND TIBC - Abnormal; Notable for the following:    Iron 33 (*)    Saturation Ratios 12 (*)    All other components within normal limits  RETICULOCYTES - Abnormal; Notable for the following:    RBC. 3.22 (*)    All other components within normal limits  POC OCCULT BLOOD, ED - Abnormal; Notable for the following:    Fecal Occult Bld POSITIVE (*)    All other components within normal limits  URINE CULTURE  TROPONIN I  ETHANOL  ACETAMINOPHEN LEVEL  AMMONIA  URINE MICROSCOPIC-ADD ON  VITAMIN B12  FOLATE  FERRITIN  TSH  RPR   Imaging Review Dg Chest 2 View  01/12/2014   CLINICAL DATA:  Hypertension.  EXAM: CHEST  2 VIEW  COMPARISON:  September 11, 2009.  FINDINGS: Stable cardiomegaly. Mild degenerative changes are noted in lower thoracic spine. No pneumothorax or significant pleural effusion is noted. No acute pulmonary disease is noted. Degenerative changes are seen involving both glenohumeral joints. There is evidence of rotator cuff injury involving both shoulders as well.  IMPRESSION: No acute cardiopulmonary abnormality seen.   Electronically Signed   By: Roque Lias M.D.   On: 01/12/2014 15:34   Ct Head Wo Contrast  01/12/2014   CLINICAL DATA:  Mental status changes.  EXAM: CT HEAD WITHOUT CONTRAST  TECHNIQUE: Contiguous axial images were obtained from the base of the skull through the vertex without intravenous contrast.  COMPARISON:  CT head without contrast 05/16/2012. MRI brain 04/21/2012.  FINDINGS: Atrophy and diffuse white matter  disease is stable bilaterally. A remote infarct of the right occipital lobe is again noted. No acute cortical infarct, hemorrhage, or mass lesion is present. There is no significant extra-axial fluid collection.  The paranasal sinuses and mastoid air cells are clear. The osseous skull is intact. Atherosclerotic calcifications are present within the cavernous carotid arteries.  IMPRESSION: 1. Stable atrophy and diffuse white matter disease. 2. And no acute intracranial abnormality. 3. Remote infarct of the right occipital lobe.   Electronically Signed   By: Gennette Pac M.D.   On: 01/12/2014 15:25      MDM   Final diagnoses:  Acute on chronic  kidney failure  Anemia    Patient brought to the ER in custody of police because of agitated and strange behavior. She was very confused and aggressive upon arrival. She does have a history of dementia, but this is apparently unusual for her compared to her baseline. Medical workup initiated. Case signed out to Doctor Gwendolyn GrantWalden.    Gilda Creasehristopher J. Pollina, MD 01/13/14 [phone]

## 2014-01-12 NOTE — ED Provider Notes (Signed)
1615- Care from Dr. Blinda LeatherwoodPollina. 58F with multiple EMS calls in the past 24 hours. Demented, has case Production designer, theatre/television/filmmanager, lives at home with help from church friends. Here without complaints, relaxing comfortably. Patient has acute on chronic renal failure and anemia with positive hemoccult. No gross blood on my rectal exam. Will admit. Dr. Sunnie Nielsenegalado admitting.  1. Acute on chronic kidney failure   2. Anemia      Dagmar HaitWilliam Merwyn Hodapp, MD 01/12/14 [phone]

## 2014-01-12 NOTE — ED Notes (Signed)
Pt brought in by caregiver/social worker/gpd; IVC states pt hostile and combative manner; talking nonstop; pt oriented to person; birthday; location; states police are trying to kill her;

## 2014-01-12 NOTE — Progress Notes (Signed)
   CARE MANAGEMENT ED NOTE 01/12/2014  Patient:  [name],[name]   Account Number:  [id number]  Date Initiated:  01/12/2014  Documentation initiated by:  Radford PaxFERRERO,Takeo Harts  Subjective/Objective Assessment:   Patient presentst o Ed with AMS.     Subjective/Objective Assessment Detail:   Patient with pmhx of heart murmur HTN, dizziness and fall. Patient with pressured speech and goes from one topic to another.  difficult to follow patient in conversation.  Bun 24 creat 2.58.     Action/Plan:   Patient to be admitted   Action/Plan Detail:   Anticipated DC Date:       Status Recommendation to Physician:   Result of Recommendation:    Other ED Services  Consult Working Plan    DC Planning Services  Other  PCP issues    Choice offered to / List presented to:            Status of service:  Completed, signed off  ED Comments:   ED Comments Detail:  EDCM spoke to patient at bedside.  EDCM asked patient if she lived alone.  Patient responded, "I live with the lord!!  He takes care of everything for me!!"  [facility] asked [facility] if she needed any help at home.  Patient responded, "No I don't need any help at home.  [name] does my laundry and my shopping for me."  Then patient started speaking quickly and loudly and said the name [name] and the name [name].  [facility] asked patient who Lynden AngVicky was and patient stated, "I don't know who she works for but she is the one who plans for the aides to come to my house." Patient reports she has aides who spend 48 hr's a week at her house from the government.  Patient reports she has a walker, cane and a raised toilet seat at home.  Patient reports she can wash and dress herself at home, "But it takes awhile and sometimes it hurts me."  Patient reports she, "Have so many [name]'s frozen dinners and I put them in the microwave and I can do that myself." Patient reports, "My doctor is [name] and Dr. Ophelia [name] is my ortopedic doctor."  [facility] spoke to Joy PatGail  [name] listed in contact information ([phone] and ([phone]) [phone]. Ms. Porfirio MylarCarmen is not a relative of the patient.  she is a church member who has been assigned to "look after" the patient and has been doing so for four years.  Ms. Porfirio MylarCarmen reports the patient is seen by [facility] eight hours a week, Mon Tues, Wed, and Thurs, two hours a day.  They bathe the patient and fix her something for breakfast and lunch.  Ms. Porfirio MylarCarmen also reports the patient recieves her husband's retirement of $1297/month.  Patient's husband worked for the railroad and is deceased.  EDCM explained home health and private duty nursing agencies to patient and offered to give her list of home health agencies and private duty agencies,  patient stated, "Why would I need that?  I don't need that.  I have all of that at home."  No further [facility] needs at this time.

## 2014-01-12 NOTE — ED Notes (Signed)
Pt physician Marletta LorJulie Barr per church member that cares for her; states behavior is not her normal; states called EMS three times today--caregiver states pt states she wasn't aware she had called EMS

## 2014-01-12 NOTE — Progress Notes (Signed)
Utilization Review completed.  Sheritta Deeg RN CM  

## 2014-01-12 NOTE — ED Notes (Addendum)
MR Brain accidentally clicked off as being completed but has not been done yet.  It is supposed to be done tomorrow as requested by Dr. Sunnie Nielsenegalado who hopes she will be more settled and calmer tomorrow.

## 2014-01-12 NOTE — ED Notes (Signed)
Pt's caregiver, Lonie PeakGrace Carmen: cell [phone], [phone] work.

## 2014-01-12 NOTE — ED Notes (Signed)
Lab sent back labels for Ammonia and Lactic Acid - new main lab tech mixed the samples up.  Pt is refusing to let me stick her.  RN tried to talk her into it with no luck.  Dr Nehemiah MassedWaldon is aware.

## 2014-01-13 ENCOUNTER — Encounter (HOSPITAL_COMMUNITY): Payer: Self-pay | Admitting: Rehabilitation

## 2014-01-13 ENCOUNTER — Other Ambulatory Visit (HOSPITAL_COMMUNITY): Payer: MEDICARE

## 2014-01-13 DIAGNOSIS — I658 Occlusion and stenosis of other precerebral arteries: Secondary | ICD-10-CM

## 2014-01-13 DIAGNOSIS — I6529 Occlusion and stenosis of unspecified carotid artery: Secondary | ICD-10-CM

## 2014-01-13 LAB — URINE CULTURE
COLONY COUNT: NO GROWTH
Culture: NO GROWTH

## 2014-01-13 LAB — IRON AND TIBC
Iron: 33 ug/dL — ABNORMAL LOW (ref 42–135)
Saturation Ratios: 12 % — ABNORMAL LOW (ref 20–55)
TIBC: 273 ug/dL (ref 250–470)
UIBC: 240 ug/dL (ref 125–400)

## 2014-01-13 LAB — VITAMIN B12: VITAMIN B 12: 318 pg/mL (ref 211–911)

## 2014-01-13 LAB — FOLATE: Folate: 13 ng/mL

## 2014-01-13 LAB — FERRITIN: Ferritin: 21 ng/mL (ref 10–291)

## 2014-01-13 MED ORDER — FERROUS SULFATE 325 (65 FE) MG PO TABS
325.0000 mg | ORAL_TABLET | Freq: Two times a day (BID) | ORAL | Status: DC
Start: 2014-01-13 — End: 2014-01-16
  Administered 2014-01-14 – 2014-01-16 (×2): 325 mg via ORAL
  Filled 2014-01-13 (×8): qty 1

## 2014-01-13 MED ORDER — VITAMINS A & D EX OINT
TOPICAL_OINTMENT | CUTANEOUS | Status: AC
  Filled 2014-01-13: qty 5

## 2014-01-13 MED ORDER — HYDRALAZINE HCL 10 MG PO TABS
10.0000 mg | ORAL_TABLET | Freq: Three times a day (TID) | ORAL | Status: DC
  Filled 2014-01-13 (×9): qty 1

## 2014-01-13 MED ORDER — LORAZEPAM 2 MG/ML IJ SOLN
0.5000 mg | Freq: Once | INTRAMUSCULAR | Status: AC
  Administered 2014-01-13: 0.5 mg via INTRAMUSCULAR
  Filled 2014-01-13: qty 1

## 2014-01-13 MED ORDER — PANTOPRAZOLE SODIUM 40 MG PO TBEC
40.0000 mg | DELAYED_RELEASE_TABLET | Freq: Every day | ORAL | Status: DC
  Administered 2014-01-14 – 2014-01-16 (×3): 40 mg via ORAL
  Filled 2014-01-13 (×3): qty 1

## 2014-01-13 NOTE — Progress Notes (Signed)
Clinical Social Work Department BRIEF PSYCHOSOCIAL ASSESSMENT 01/13/2014  Patient:  [name], [name]     Account Number:  [id number]     Admit date:  01/12/2014  Clinical Social Worker:  Dennison Bulla  Date/Time:  01/13/2014 10:30 AM  Referred by:  Physician  Date Referred:  01/13/2014 Referred for  SNF Placement   Other Referral:   Interview type:  Patient Other interview type:    PSYCHOSOCIAL DATA Living Status:  ALONE Admitted from facility:   Level of care:   Primary support name:  [name] Primary support relationship to patient:  FRIEND Degree of support available:   Adequate    CURRENT CONCERNS Current Concerns  Post-Acute Placement   Other Concerns:    SOCIAL WORK ASSESSMENT / PLAN CSW received referral to assist with DC planning. Per chart review, patient was brought to the hospital under IVC. Patient was calling EMS but then refusing services. Patient's SW at [facility] petitioned for IVC and patient was brought to the hospital. CSW spoke with RN and met with patient at bedside.    Patient laying in bed and watching TV. Patient appears confused and agitated. Patient reports people are trying to poison her and take her money. Patient is unable to be redirected and unable to provide any background history. Patient agreeable for CSW to talk with her friend [name].    [name] came to room to visit patient. [name] has known patient for over 20 years through their church. [name] is not legally patient's guardian but reports no family is involved with patient. Friend states that she has never seen patient behave in this manner and is worried that she is sick. Friend states that patient is usually calm and cooperative but can be "strong-willed." Friend gives the example that patient has fired several HH agencies. Patient currently gives aide help 2 hours a day, 4 days a week. Friend has spoken to DSS worker who reports patient is not eligible for any additional hours. Friend is concerned  about patient returning home and reports that patient has been unable to pay bills effectively and is paranoid that people are poisoning her or taking her money. Friend is unaware of any MH diagnosis in the past. Friend reports they have tried to convince patient to be placed in the past but states that patient has refused. CSW suggests that it be determined if patient has capacity to make these decisions at this time.    CSW called DSS worker Engineer, maintenance ([place]) [phone]) and left a message in order to gather more information. Friend agreeable to [facility] search for SNF placement. CSW explained process and will discuss with DSS as well. Per friend, DSS is not patient's guardian either.    CSW completed FL2 and pasarr and faxed out. CSW will continue to follow.   Assessment/plan status:  Psychosocial Support/Ongoing Assessment of Needs Other assessment/ plan:   Information/referral to community resources:   SNF information    PATIENT'S/FAMILY'S RESPONSE TO PLAN OF CARE: Patient disoriented and unable to participate in assessment. Patient is upset that friend brought her to the hospital but reports as long as she receives good care she will agree to stay at the hospital. Patient's friend is very involved but concerned about patient's wellbeing. Friend reports she is limited on how much she can do for patient and sometimes feels guilty that patient is still living alone. Friend is agreeable to help make decisions for patient and reports she speaks with DSS worker often. Friend appreciative and engaged in assessment.  [place], [place] [phone]

## 2014-01-13 NOTE — Evaluation (Signed)
Physical Therapy Evaluation Patient Details Name: [name] MRN: [id number] DOB: 05-09-1917 Today's Date: 01/13/2014 Time: 1324-4010 PT Time Calculation (min): 34 min  PT Assessment / Plan / Recommendation History of Present Illness  80 yo female admitted with AMS, renal failure. Hx of HTN, dizziness/vertigo, fall  Clinical Impression  On eval, pt required Mod assist of 2 for mobility-able to ambulate ~15 feet (x2) with 2 HHA. Demonstrates general weakness, decreased activity tolerance, and impaired gait and balance. Pt is very confused. Rambling conversation "with the Lord" per pt. Pt is verbally abusive, while also using racial slurs towards everyone.  Agitated at times but can be redirected with time. Pt is at high risk for falls due to impaired standing balance and dizziness. Recommend SNF.     PT Assessment  Patient needs continued PT services    Follow Up Recommendations  SNF;Supervision/Assistance - 24 hour    Does the patient have the potential to tolerate intense rehabilitation      Barriers to Discharge        Equipment Recommendations  None recommended by PT    Recommendations for Other Services OT consult   Frequency Min 3X/week    Precautions / Restrictions Precautions Precautions: Fall Restrictions Weight Bearing Restrictions: No   Pertinent Vitals/Pain R shoulder at times; L foot/LE at times. unrated      Mobility  Bed Mobility Overal bed mobility: Needs Assistance Bed Mobility: Supine to Sit;Sit to Supine Supine to sit: Mod assist Sit to supine: Min guard;HOB elevated General bed mobility comments: Assist for trunk to upright. Increased time. Multimodal cues for safety, technique, hand placement.  Transfers Overall transfer level: Needs assistance Transfers: Sit to/from Stand Sit to Stand: Mod assist;From elevated surface General transfer comment: Assist to rise , stabilize, control descent. Multimodal cues for safety, technique, hand placement.  Pt c/o dizziness.  Ambulation/Gait Ambulation/Gait assistance: Mod assist Ambulation Distance (Feet): 15 Feet Assistive device: 2 person hand held assist General Gait Details: Assist to stabilize throughout ambulation. Unsteady. LOB posteriorly intermitently    Exercises     PT Diagnosis: Altered mental status;Generalized weakness;Abnormality of gait;Difficulty walking  PT Problem List: Decreased strength;Decreased activity tolerance;Decreased mobility;Obesity;Pain;Decreased cognition;Decreased safety awareness;Decreased knowledge of use of DME;Decreased balance PT Treatment Interventions: DME instruction;Gait training;Functional mobility training;Therapeutic activities;Therapeutic exercise;Patient/family education;Balance training     PT Goals(Current goals can be found in the care plan section) Acute Rehab PT Goals Patient Stated Goal: none stated PT Goal Formulation: Patient unable to participate in goal setting Time For Goal Achievement: 01/27/14 Potential to Achieve Goals: Fair  Visit Information  Last PT Received On: 01/13/14 Assistance Needed: +2 History of Present Illness: 80 yo female admitted with AMS, renal failure. Hx of HTN, dizziness/vertigo, fall       Prior Functioning  Home Living Family/patient expects to be discharged to:: Skilled nursing facility Living Arrangements: Alone Communication Communication: No difficulties    Cognition  Cognition Arousal/Alertness: Awake/alert Behavior During Therapy: WFL for tasks assessed/performed Overall Cognitive Status: Impaired/Different from baseline Area of Impairment: Attention;Memory;Orientation;Safety/judgement;Following commands;Problem solving Orientation Level: Place;Time;Situation Current Attention Level: Focused Memory: Decreased short-term memory Following Commands: Follows one step commands inconsistently Safety/Judgement: Decreased awareness of deficits;Decreased awareness of safety Problem Solving:  Difficulty sequencing;Requires tactile cues;Requires verbal cues    Extremity/Trunk Assessment Upper Extremity Assessment Upper Extremity Assessment: Generalized weakness;RUE deficits/detail RUE Deficits / Details: pt reports hx of rotator cuff impairment Lower Extremity Assessment Lower Extremity Assessment: Generalized weakness Cervical / Trunk Assessment Cervical / Trunk Assessment: Normal  Balance Balance Overall balance assessment: Needs assistance Sitting-balance support: Bilateral upper extremity supported;Feet supported Sitting balance-Leahy Scale: Good Standing balance support: During functional activity;Bilateral upper extremity supported Standing balance-Leahy Scale: Poor  End of Session PT - End of Session Activity Tolerance: Patient limited by fatigue Patient left: in bed;with call bell/phone within reach;with bed alarm set  GP     Rebeca AlertJannie Lachina Salsberry, MPT Pager: [phone]

## 2014-01-13 NOTE — Progress Notes (Signed)
Patient arrived to unit via stretcher from ER accompanied by safety attendant and nursing staff. Patient is alert to self. She is uncooperative yelling, cursing staff, and physically aggressive towards staff. She refuses IV, vital signs, and admission information. Will give patient time to get settled down and attempt admission history again. No family present with patient.

## 2014-01-13 NOTE — Progress Notes (Signed)
Patient ID: [name] Akhter, female   DOB: 1917/07/14, 80 y.o.   MRN: [id number]  [facility] PROGRESS NOTE  [name] Mundie [id number] DOB: 1917/07/14 DOA: 01/12/2014 PCP: Marletta LorBarr, Julie, NP  Brief narrative: 80 y.o. female with PMH significant for Hypertension who [facility] and involuntarily committed for further evaluation. Patient brought to the ER by police for evaluation of involuntary commitment.    In ED, she was found to have Cr at 2.5 from prior at 1.5, Hgb 8.8. ED physician performed rectal exam and was guaiac positive. [facility] asked to admit for further evaluation.   Active Problems:   Acute encephalopathy - unclear etiology, pt is alert and orient to name and DOB this AM, not oriented to place and time - she known name of PCP and their location  - it is unclear what is the baseline mental functioning  - pt with good appetite this AM and tolerating regular diet well  - pt has been refusing blood work and placement of IV lines - will continue supportive care as pt willing to accept and will work on placement    Acute on chronic renal failure - this is likely secondary to poor oral intake and dehydration, pre renal in etiology - again, pt has been refusing IV access placement, no IVF, no blood work - she is eating and drinking fluids this AM and tolerating well, will respect her wishes and try again in AM   Accelerated HTN - will place on oral Hydralazine if she is wiling to take   Anemia, acute blood loss - uncler etiology but possibly lower GI bleed, FOBT positive - last Hg in 04/2012 was WNL - iron is low so will place on iron supplement - repeat CBC in AM if pt agrees    Leukocytosis - appears to be reactive, UA and CXR unremarkable for acute events  - pt afebrile over the past 24 hours    Dementia - remote infarction noted on CT head but pt refusing MRI - stable atrophy noted  - will hold off on MRI for now, supportive care - PT evaluation  -  continue Aspirin   Consultants:  None  Procedures/Studies: Dg Chest 2 View   01/12/2014  No acute cardiopulmonary abnormality seen.   Ct Head Wo Contrast  01/12/2014   Stable atrophy and diffuse white matter disease, no acute intracranial abnormality. Remote infarct of the right occipital lobe.  Antibiotics:  None  Code Status: Full Family Communication: Pt at bedside Disposition Plan: Remains inpatient   HPI/Subjective: No events overnight.   Objective: Filed Vitals:   01/12/14 1402 01/12/14 1745  BP: 184/93 185/63  Pulse: 85 87  Temp: 97.9 F (36.6 C)   TempSrc: Axillary   Resp: 20 16  SpO2: 95% 100%    Intake/Output Summary (Last 24 hours) at 01/13/14 1029 Last data filed at 01/13/14 0816  Gross per 24 hour  Intake    360 ml  Output      0 ml  Net    360 ml    Exam:   General:  Pt is alert, follows commands appropriately, not in acute distress  Cardiovascular: Regular rate and rhythm, S1/S2, no murmurs, no rubs, no gallops  Respiratory: Clear to auscultation bilaterally, no wheezing, no crackles, no rhonchi  Abdomen: Soft, non tender, non distended, bowel sounds present, no guarding  Extremities: No edema, pulses DP and PT palpable bilaterally  Data Reviewed: Basic Metabolic Panel:  Recent Labs Lab 01/12/14  1533  NA 143  K 4.2  CL 107  CO2 24  GLUCOSE 130*  BUN 24*  CREATININE 2.58*  CALCIUM 9.3   Liver Function Tests:  Recent Labs Lab 01/12/14 1533  AST 16  ALT 8  ALKPHOS 63  BILITOT <0.2*  PROT 6.9  ALBUMIN 3.0*    Recent Labs Lab 01/12/14 1533  AMMONIA 44   CBC:  Recent Labs Lab 01/12/14 1533  WBC 13.1*  NEUTROABS 10.6*  HGB 8.8*  HCT 26.2*  MCV 80.6  PLT 445*   Cardiac Enzymes:  Recent Labs Lab 01/12/14 1533  TROPONINI <0.30   Scheduled Meds: . aspirin EC  81 mg Oral Daily  . senna  1 tablet Oral Daily  . vitamin A & D       Continuous Infusions: . sodium chloride     Debbora Presto,  MD  [facility] Pager [phone]  If 7PM-7AM, please contact night-coverage [email or web address] Password [email or web address] 01/13/2014, 10:29 AM   LOS: 1 day

## 2014-01-13 NOTE — Progress Notes (Addendum)
Clinical Social Work Department CLINICAL SOCIAL WORK PLACEMENT NOTE 01/13/2014  Patient:  [name],[name]  Account Number:  [id number] Admit date:  01/12/2014  Clinical Social Worker:  Unk LightningHOLLY Salah Burlison, LCSW  Date/time:  01/13/2014 10:30 AM  Clinical Social Work is seeking post-discharge placement for this patient at the following level of care:   SKILLED NURSING   (*CSW will update this form in Epic as items are completed)   01/13/2014  Patient/family provided with [facility] of Clinical Social Work's list of facilities offering this level of care within the geographic area requested by the patient (or if unable, by the patient's family).  01/13/2014  Patient/family informed of their freedom to choose among providers that offer the needed level of care, that participate in Medicare, Medicaid or managed care program needed by the patient, have an available bed and are willing to accept the patient.  01/13/2014  Patient/family informed of [facility]' ownership interest in [facility], as well as of the fact that they are under no obligation to receive care at this facility.  PASARR submitted to [employer] on 01/13/2014 PASARR number received from [employer] on 01/13/2014  FL2 transmitted to all facilities in geographic area requested by pt/family on  01/13/2014 FL2 transmitted to all facilities within larger geographic area on   Patient informed that his/her managed care company has contracts with or will negotiate with  certain facilities, including the following:     Patient/family informed of bed offers received:  01/14/14 [facility] Physician recommends and patient chooses bed at    [facility] be [facility]  01/16/14 [facility]  The following physician request were entered in Epic:   Additional Comments:

## 2014-01-14 MED ORDER — LORAZEPAM 2 MG/ML IJ SOLN
0.7500 mg | Freq: Once | INTRAMUSCULAR | Status: AC
  Administered 2014-01-15: 0.75 mg via INTRAMUSCULAR
  Filled 2014-01-14: qty 1

## 2014-01-14 NOTE — Progress Notes (Signed)
OT Cancellation Note  Patient Details Name: [name] MRN: [id number] DOB: December 09, 1916   Cancelled Treatment:    Reason Eval/Treat Not Completed: Other (comment) (D/c plan is SNF; will defer OT eval/tx to SNF)  Rorey Bisson A 01/14/2014, 8:27 AM

## 2014-01-14 NOTE — Progress Notes (Signed)
Pt has refused to take any medications from me.  Grabbed meds  and threw them across room.  Stated I was trying to kill her.  If I tried to talk to her she told me to SHUT UP. So I did.  Saw no reason to try and continue.  Pt is very confused, angry, paranoid, laughing at nothing, yelling, name call, cussing at you, racist names to ALL races.  Wants it done her way but have to watch our for her safety.  She can not stand on her own.  But she thinks she can

## 2014-01-14 NOTE — Progress Notes (Signed)
Patient ID: [name], female   DOB: Aug 22, 1917, 80 y.o.   MRN: [id number]  [facility] PROGRESS NOTE  [name] DOB: Aug 22, 1917 DOA: 01/12/2014 PCP: Marletta LorBarr, Julie, NP  Brief narrative:  [age] y.o. female with PMH significant for Hypertension [facility] and involuntarily committed for further evaluation. Patient brought to the ER by police for evaluation of involuntary commitment.   In ED, she was found to have Cr at 2.5 from prior at 1.5, Hgb 8.8. ED physician performed rectal exam and was guaiac positive. [facility] asked to admit for further evaluation.   Active Problems:  Acute encephalopathy  - unclear etiology, pt is alert and oriented to name and DOB this AM, not oriented to place and time  - it is unclear what is the baseline mental functioning  - pt with good appetite this AM and tolerating regular diet well  - pt has been refusing blood work and placement of IV lines, refusing vitals checks  - will continue supportive care as pt willing to accept  - placement in progress  Acute on chronic renal failure  - this is likely secondary to poor oral intake and dehydration, pre renal in etiology  - again, pt has been refusing IV access placement, no IVF, no blood work  - she is eating and drinking fluids this AM and tolerating well, will respect her wishes Accelerated HTN  - placed on oral hydralazine but pt refusing BP checks  Anemia, acute blood loss  - uncler etiology but possibly lower GI bleed, FOBT positive  - last Hg in 04/2012 was WNL  - iron is low, placed on iron supplement  Leukocytosis  - appears to be reactive, UA and CXR unremarkable for acute events  - pt afebrile over the past 48 hours  Dementia  - remote infarction noted on CT head but pt refusing MRI  - stable atrophy noted  - will hold off on MRI for now, supportive care  - PT evaluation if pt agrees  - placement in progress   Consultants:  None  Procedures/Studies:   Dg Chest 2 View 01/12/2014 No acute cardiopulmonary abnormality seen.  Ct Head Wo Contrast 01/12/2014 Stable atrophy and diffuse white matter disease, no acute intracranial abnormality. Remote infarct of the right occipital lobe.  Antibiotics:  None  Code Status: Full  Family Communication: No family at bedside  Disposition Plan: Remains inpatient, pt involuntarily committed by police department, placement in progress     HPI/Subjective: No events overnight.   Objective: Filed Vitals:   01/12/14 1402 01/12/14 1745 01/13/14 1513  BP: 184/93 185/63 184/72  Pulse: 85 87 81  Temp: 97.9 F (36.6 C)  98.6 F (37 C)  TempSrc: Axillary  Axillary  Resp: 20 16 20   SpO2: 95% 100% 96%    Intake/Output Summary (Last 24 hours) at 01/14/14 1336 Last data filed at 01/13/14 1902  Gross per 24 hour  Intake    240 ml  Output      0 ml  Net    240 ml    Exam:   General:  Pt is alert, follows some commands appropriately, not in acute distress, confused and oriented to name only   Cardiovascular: Regular rate and rhythm, S1/S2, no murmurs, no rubs, no gallops  Respiratory: Clear to auscultation bilaterally, scattered rhonchi bilaterally   Abdomen: Soft, non tender, non distended, bowel sounds present, no guarding  Extremities: No edema, pulses DP and PT palpable bilaterally  Data  Reviewed: Basic Metabolic Panel:  Recent Labs Lab 01/12/14 1533  NA 143  K 4.2  CL 107  CO2 24  GLUCOSE 130*  BUN 24*  CREATININE 2.58*  CALCIUM 9.3   Liver Function Tests:  Recent Labs Lab 01/12/14 1533  AST 16  ALT 8  ALKPHOS 63  BILITOT <0.2*  PROT 6.9  ALBUMIN 3.0*    Recent Labs Lab 01/12/14 1533  AMMONIA 44   CBC:  Recent Labs Lab 01/12/14 1533  WBC 13.1*  NEUTROABS 10.6*  HGB 8.8*  HCT 26.2*  MCV 80.6  PLT 445*   Cardiac Enzymes:  Recent Labs Lab 01/12/14 1533  TROPONINI <0.30     Recent Results (from the past 240 hour(s))  URINE CULTURE     Status: None    Collection Time    01/12/14  2:50 PM      Result Value Ref Range Status   Specimen Description URINE, CLEAN CATCH   Final   Special Requests NONE   Final   Culture  Setup Time     Final   Value: 01/12/2014 22:40     Performed at [facility] Count     Final   Value: NO GROWTH     Performed at [facility]   Culture     Final   Value: NO GROWTH     Performed at [facility]   Report Status 01/13/2014 FINAL   Final     Scheduled Meds: . aspirin EC  81 mg Oral Daily  . ferrous sulfate  325 mg Oral BID WC  . hydrALAZINE  10 mg Oral 3 times per day  . pantoprazole  40 mg Oral Daily  . senna  1 tablet Oral Daily   Continuous Infusions:    Debbora Presto, MD  [facility] Pager [phone]  If 7PM-7AM, please contact night-coverage [email or web address] Password [email or web address] 01/14/2014, 1:36 PM   LOS: 2 days

## 2014-01-14 NOTE — Progress Notes (Signed)
Pt is confused, delusional.  Will call you names then will laugh, then snap at you. All in a matter of a few seconds and  for no reason.  Has no short term memory.  Appears angry and everything is a conspiracy.  Afraid we are trying to kill her then the next she is the queen and we have to treat her like one. Not able to reorient to time or place.  Her thought are to jumbled and change to fast to keep up with.  Very cautious with safety for her but she does not agree.  Ativan 0.5 im did help her sleep for about five hours.  Still incontenent of  Urine multi times tonight.  Will turn for you but will call you names while she does it.  So far no hitting but she will grab you then let go.

## 2014-01-14 NOTE — Progress Notes (Signed)
Provided Pt's friend, Delorise ShinerGrace, with bed offers.  Reviewed offers with Delorise ShinerGrace and answered questions.  CSW thanked [place] for her time.  Weekday CSW to follow.  Providence CrosbyAmanda Adalida Garver, LCSWA Clinical Social Work [phone]

## 2014-01-15 DIAGNOSIS — I10 Essential (primary) hypertension: Secondary | ICD-10-CM

## 2014-01-15 MED ORDER — HYDRALAZINE HCL 25 MG PO TABS
25.0000 mg | ORAL_TABLET | Freq: Four times a day (QID) | ORAL | Status: DC | PRN
  Filled 2014-01-15: qty 1

## 2014-01-15 MED ORDER — CLONIDINE HCL 0.1 MG/24HR TD PTWK
0.1000 mg | MEDICATED_PATCH | TRANSDERMAL | Status: DC
  Administered 2014-01-16: 0.1 mg via TRANSDERMAL
  Filled 2014-01-15: qty 1

## 2014-01-15 MED ORDER — ENSURE COMPLETE PO LIQD
237.0000 mL | Freq: Two times a day (BID) | ORAL | Status: DC
  Administered 2014-01-16: 237 mL via ORAL

## 2014-01-15 MED ORDER — HALOPERIDOL LACTATE 5 MG/ML IJ SOLN
1.0000 mg | Freq: Four times a day (QID) | INTRAMUSCULAR | Status: DC | PRN
  Administered 2014-01-16: 1 mg via INTRAMUSCULAR
  Administered 2014-01-16: 2 mg via INTRAMUSCULAR
  Filled 2014-01-15 (×3): qty 1

## 2014-01-15 NOTE — Progress Notes (Signed)
Patient ID: [name], female   DOB: 10-25-1917, 80 y.o.   MRN: [id number]  [facility] PROGRESS NOTE  [name] WUJ:[id number] DOB: 1917-10-18 DOA: 01/12/2014 PCP: Marletta Lor, NP  Brief narrative:  80 y.o. female with PMH significant for Hypertension who presented to [facility] ED with AMS and involuntarily committed for further evaluation. Patient brought to the ER by police for evaluation of involuntary commitment.   In ED, she was found to have Cr at 2.5 from prior at 1.5, Hgb 8.8. ED physician performed rectal exam and was guaiac positive. [facility] asked to admit for further evaluation.   Active Problems:  Acute encephalopathy, unclear etiology but possibly related to acute stroke, pt is alert and oriented to name but not place or time.  Seems to be having some visual hallucinations, picking at the air.  Per friend, she has never been aggressive or used profanity until now but symptoms have persisted since admission -  CXR NAD -  UA neg -  CT with evidence of old infarct but no new disease -  Patient refusing MRI and most medications -  Continue haldol and ativan IM for agitation -  Psychiatry consultation -  SW attempting to coordinate with niece about placement  Acute on chronic renal failure likely prerenal - pt has been refusing IV access placement, no IVF, no blood work  - she is eating and drinking fluids this AM and tolerating well, will respect her wishes  Accelerated HTN  -  Refusing oral medications -  Trial of clonidine patch   Anemia, acute blood loss  - uncler etiology but possibly lower GI bleed, FOBT positive  - last Hg in 04/2012 was WNL  - iron is low, placed on iron supplement but patient refusing  Leukocytosis  - appears to be reactive, UA and CXR unremarkable for acute events  - pt remains afebrile  Dementia  - remote infarction noted on CT head but pt refusing MRI  - Also refusing labs, so TSH, RPR, B12 not obtained - PT evaluation if pt agrees  -  placement in progress   Consultants:  None  Procedures/Studies:  Dg Chest 2 View 01/12/2014 No acute cardiopulmonary abnormality seen.  Ct Head Wo Contrast 01/12/2014 Stable atrophy and diffuse white matter disease, no acute intracranial abnormality. Remote infarct of the right occipital lobe.  Antibiotics:  None  Code Status: Full  Family Communication:  Spoke with friend who as at bedside Disposition Plan: Remains inpatient, pt involuntarily committed by police department, placement in progress, possibly dementia unit vs. SNF  HPI/Subjective: Agitated, hitting at nursing staff overnight.  Continues to refuse medications and vital signs.  Using profanity  Objective: Filed Vitals:   01/12/14 1745 01/13/14 1513 01/14/14 1502 01/15/14 1431  BP: 185/63 184/72  175/71  Pulse: 87 81 92 76  Temp:  98.6 F (37 C) 98.2 F (36.8 C) 98.5 F (36.9 C)  TempSrc:  Axillary Axillary Axillary  Resp: 16 20 20 20   SpO2: 100% 96% 95% 97%   No intake or output data in the 24 hours ending 01/15/14 1547  Exam:   General:  Pt is alert, follows some commands appropriately, not in acute distress, confused and oriented to name only  Cardiovascular: Regular rate and rhythm, S1/S2, no murmurs, no rubs, no gallops  Respiratory: Clear to auscultation bilaterally, scattered rhonchi bilaterally   Abdomen: Soft, non tender, non distended, bowel sounds present, no guarding  Extremities: No edema, pulses DP and PT palpable bilaterally  Data  Reviewed: Basic Metabolic Panel:  Recent Labs Lab 01/12/14 1533  NA 143  K 4.2  CL 107  CO2 24  GLUCOSE 130*  BUN 24*  CREATININE 2.58*  CALCIUM 9.3   Liver Function Tests:  Recent Labs Lab 01/12/14 1533  AST 16  ALT 8  ALKPHOS 63  BILITOT <0.2*  PROT 6.9  ALBUMIN 3.0*    Recent Labs Lab 01/12/14 1533  AMMONIA 44   CBC:  Recent Labs Lab 01/12/14 1533  WBC 13.1*  NEUTROABS 10.6*  HGB 8.8*  HCT 26.2*  MCV 80.6  PLT 445*   Cardiac  Enzymes:  Recent Labs Lab 01/12/14 1533  TROPONINI <0.30     Recent Results (from the past 240 hour(s))  URINE CULTURE     Status: None   Collection Time    01/12/14  2:50 PM      Result Value Ref Range Status   Specimen Description URINE, CLEAN CATCH   Final   Special Requests NONE   Final   Culture  Setup Time     Final   Value: 01/12/2014 22:40     Performed at [facility]   Colony Count     Final   Value: NO GROWTH     Performed at [facility]   Culture     Final   Value: NO GROWTH     Performed at [facility]   Report Status 01/13/2014 FINAL   Final     Scheduled Meds: . aspirin EC  81 mg Oral Daily  . feeding supplement (ENSURE COMPLETE)  237 mL Oral BID BM  . ferrous sulfate  325 mg Oral BID WC  . hydrALAZINE  10 mg Oral 3 times per day  . pantoprazole  40 mg Oral Daily  . senna  1 tablet Oral Daily   Continuous Infusions:    Joy FickleSHORT, Joy Vecchione, MD  [facility] Pager [phone]  If 7PM-7AM, please contact night-coverage [email or web address] Password [facility] 01/15/2014, 3:47 PM   LOS: 3 days

## 2014-01-15 NOTE — Plan of Care (Signed)
Problem: Phase I Progression Outcomes Goal: Voiding-avoid urinary catheter unless indicated Outcome: Progressing Incontinent     

## 2014-01-15 NOTE — Progress Notes (Signed)
Pt still yelling out, cussing.  Cont. To reposition her and try and talk to her.  She tells us where we can go.  Her thoughts wander so fast can not keep up with them.

## 2014-01-15 NOTE — Plan of Care (Signed)
Problem: Phase I Progression Outcomes Goal: Hemodynamically stable Outcome: Progressing Psychiatry consult

## 2014-01-15 NOTE — Progress Notes (Signed)
Pt yelling at everyone that goes into her room, even when no one is in the room. Verbally and physically abusive. Acting paranoid, questioning whether poison was put in her water. Very untrusting. Caregiver "Delorise ShinerGrace" came in to visit and helped her to eat. Was anxious and combative even with Delorise ShinerGrace there to help her trying to change her sheets and gown because she was incontinent. Refuses to take any medications.

## 2014-01-15 NOTE — Progress Notes (Signed)
Pt very confused to night.  Slept for about four hours. Once she woke up she wanted to leave, wash her face, use BSC, fight with staff call us names. Hit at us.  Would not stay in bed.  Pt could get up before staff could  Get to her safely. Waist restraint applied but she was able to get out of it.  Then she was placed in two point soft restraints on her wrist.  Continue to monitor closely due to her strength and ability to move fast.

## 2014-01-15 NOTE — Progress Notes (Signed)
INITIAL NUTRITION ASSESSMENT  DOCUMENTATION CODES Per approved criteria  -Not Applicable   INTERVENTION: Ensure Complete po BID, each supplement provides 350 kcal and 13 grams of protein  NUTRITION DIAGNOSIS: Inadequate oral intake related to AMS as evidenced by RD observation of meal trays.   Goal: Pt to meet >/= 90% of their estimated nutrition needs   Monitor:  Wt, po intake, acceptance of supplements, I/O's  Reason for Assessment: MST  80 y.o. female  Admitting Dx: <principal problem not specified>  ASSESSMENT: 80 y.o. female with PMH significant for Hypertension who presents with AMS. Per report patient called multiple times to EMS today without clear reason. When EMS arrived at her home, however, she refuses to get in the ambulance. She will not tell them why she called.  Patient brought to the ER by police for evaluation of involuntary commitment. Patient has been notice to be confuse and agitated, per ED record this is something new for patient.   Pt was very vulgar during RD visit. She yelled, cussed and called RD names. Pt has no height or weight history on file. Due to pt being combative, RN was unable to weigh pt. Pt was unsure of how tall she is. PMH has a record of pt as 5'2''. When asked if she had been eating pt replied "I'm alive aren't I!?" Pt said that she would like to have Ensure Complete. Pt did not appear to be malnourished. RD observed pt's meal trays, and she had eaten very little of them.  Height: Ht Readings from Last 1 Encounters:  04/23/12 5\' 2"  (1.575 m)    Weight: Wt Readings from Last 1 Encounters:  04/23/12 197 lb 5 oz (89.5 kg)    Ideal Body Weight: 110 lbs  % Ideal Body Weight: unknown  Wt Readings from Last 10 Encounters:  04/23/12 197 lb 5 oz (89.5 kg)    Usual Body Weight: unknown  % Usual Body Weight: unknown  BMI:  There is no weight on file to calculate BMI.  Estimated Nutritional Needs: Kcal: unable to be  obtained Protein: n/a Fluid: n/a  Skin: intact  Diet Order: General  EDUCATION NEEDS: -Education not appropriate at this time  No intake or output data in the 24 hours ending 01/15/14 1419  Last BM: none recorded   Labs:   Recent Labs Lab 01/12/14 1533  NA 143  K 4.2  CL 107  CO2 24  BUN 24*  CREATININE 2.58*  CALCIUM 9.3  GLUCOSE 130*    CBG (last 3)  No results found for this basename: GLUCAP,  in the last 72 hours  Scheduled Meds: . aspirin EC  81 mg Oral Daily  . ferrous sulfate  325 mg Oral BID WC  . hydrALAZINE  10 mg Oral 3 times per day  . pantoprazole  40 mg Oral Daily  . senna  1 tablet Oral Daily    Continuous Infusions:   Past Medical History  Diagnosis Date  . Heart murmur   . Hypertension   . Dizziness 04/2012  . Fall     Past Surgical History  Procedure Laterality Date  . Abdominal hysterectomy    . Leg surgery      pt states has a rod in right leg    Ebbie LatusHaley Hawkins RD, LDN

## 2014-01-16 DIAGNOSIS — F29 Unspecified psychosis not due to a substance or known physiological condition: Secondary | ICD-10-CM

## 2014-01-16 DIAGNOSIS — F05 Delirium due to known physiological condition: Secondary | ICD-10-CM

## 2014-01-16 MED ORDER — OLANZAPINE 5 MG PO TABS: 5.0000 mg | ORAL_TABLET | Freq: Every day | ORAL | Status: AC

## 2014-01-16 MED ORDER — FERROUS SULFATE 325 (65 FE) MG PO TABS: 325.0000 mg | ORAL_TABLET | Freq: Two times a day (BID) | ORAL | Status: AC

## 2014-01-16 MED ORDER — HYDRALAZINE HCL 25 MG PO TABS: 25.0000 mg | ORAL_TABLET | Freq: Three times a day (TID) | ORAL | Status: AC

## 2014-01-16 MED ORDER — CLONIDINE HCL 0.1 MG/24HR TD PTWK: 0.1000 mg | MEDICATED_PATCH | TRANSDERMAL | Status: AC

## 2014-01-16 NOTE — Progress Notes (Signed)
Pt has talked all night long.  Subject jumps rapidly from topic to topic.  She has made no effort to get out of bed.

## 2014-01-16 NOTE — Discharge Instructions (Signed)
Altered Mental Status °Altered mental status most often refers to an abnormal change in your responsiveness and awareness. It can affect your speech, thought, mobility, memory, attention span, or alertness. It can range from slight confusion to complete unresponsiveness (coma). Altered mental status can be a sign of a serious underlying medical condition. Rapid evaluation and medical treatment is necessary for patients having an altered mental status. °CAUSES  °· Low blood sugar (hypoglycemia) or diabetes. °· Severe loss of body fluids (dehydration) or a body salt (electrolyte) imbalance. °· A stroke or other neurologic problem, such as dementia or delirium. °· A head injury or tumor. °· A drug or alcohol overdose. °· Exposure to toxins or poisons. °· Depression, anxiety, and stress. °· A low oxygen level (hypoxia). °· An infection. °· Blood loss. °· Twitching or shaking (seizure). °· Heart problems, such as heart attack or heart rhythm problems (arrhythmias). °· A body temperature that is too low or too high (hypothermia or hyperthermia). °DIAGNOSIS  °A diagnosis is based on your history, symptoms, physical and neurologic examinations, and diagnostic tests. Diagnostic tests may include: °· Measurement of your blood pressure, pulse, breathing, and oxygen levels (vital signs). °· Blood tests. °· Urine tests. °· X-ray exams. °· A computerized magnetic scan (magnetic resonance imaging, MRI). °· A computerized X-ray scan (computed tomography, CT scan). °TREATMENT  °Treatment will depend on the cause. Treatment may include: °· Management of an underlying medical or mental health condition. °· Critical care or support in the hospital. °HOME CARE INSTRUCTIONS  °· Only take over-the-counter or prescription medicines for pain, discomfort, or fever as directed by your caregiver. °· Manage underlying conditions as directed by your caregiver. °· Eat a healthy, well-balanced diet to maintain strength. °· Join a support group or  prevention program to cope with the condition or trauma that caused the altered mental status. Ask your caregiver to help choose a program that works for you. °· Follow up with your caregiver for further examination, therapy, or testing as directed. °SEEK MEDICAL CARE IF:  °· You feel unwell or have chills. °· You or your family notice a change in your behavior or your alertness. °· You have trouble following your caregiver's treatment plan. °· You have questions or concerns. °SEEK IMMEDIATE MEDICAL CARE IF:  °· You have a rapid heartbeat or have chest pain. °· You have difficulty breathing. °· You have a fever. °· You have a headache with a stiff neck. °· You cough up blood. °· You have blood in your urine or stool. °· You have severe agitation or confusion. °MAKE SURE YOU:  °· Understand these instructions. °· Will watch your condition. °· Will get help right away if you are not doing well or get worse. °Document Released: 04/30/2010 Document Revised: 02/02/2012 Document Reviewed: 04/30/2010 °ExitCare® Patient Information ©2014 [employer]. ° °

## 2014-01-16 NOTE — Progress Notes (Signed)
[facility]

## 2014-01-16 NOTE — Progress Notes (Signed)
Clinical Social Work  CSW received handoff that patient's niece [name]([name] [phone]) had called re: patient. CSW called and spoke with niece who reports that patient was married but husband passed away about 25 years ago. Patient also had two sons but they have both passed away. Patient has been living alone and does not have any family that lives nearby. Patient's niece and dtr-in-law [place] and patient has a god daughter that niece believes lives in [place]. Niece reports that patient has grandson [name]([name]) who has stated he does not want to be involved and does not want niece to give his information to hospital staff. Niece reports patient does not have POA or HCPOA and even though she calls and checks on patient periodically, she does not know much about her medical conditions or history. Niece reports she is unaware of any MH diagnosis or medications. Niece reports since she is not local and does not know much about patient she would prefer that friend [name]([name]) make decisions re: placement.  CSW called and left another message with DSS worker. Due to recent behaviors, CSW called SNFs who offered beds and awaiting call back to determine which facilities can continue to offer a bed. CSW will continue to follow.  [place]well[place]olly Kekai [name], [place] [phone]

## 2014-01-16 NOTE — Progress Notes (Signed)
Pt make great effort to get out of bed without assistance.  Gave 2 mg of haldol and sat with her until she calmed down.  After an hour she was still going for it.  But her verbal rhetoric was quiet for the first time tonight

## 2014-01-16 NOTE — Progress Notes (Addendum)
Clinical Social Work  CSW spoke with DSS worker (Ms. Ernestina PennaWiggins) who explained she has known patient for over 7 years and assisted with getting patient IVC due to patient requiring medical treatment. DSS reports they have petitioned for guardianship in the past but courts deemed patient to be competent at that time. (Last attempt was in September 2014). DSS worker reports that it seems that patient is incapable of making decisions at this time and since family is not involved then she would discuss case with her supervisor to determine if they needed to apply for guardianship again. DSS worker agreeable to assist patient in completing Medicaid application as well in order to ensure payment to SNF since it appears that LT SNF might be needed at this time. DSS worker agrees that patient needs placement and it is unsafe for her to return home at DC.   CSW spoke with friend, Delorise ShinerGrace, who is agreeable to patient [facility]. CSW spoke directly to admissions coordinator re: patient's behaviors and SNF agreeable to accept. Friend reports she will come to the hospital and will ensure patient is agreeable to go to SNF at DC. Friend understanding of plans and agreeable to [facility] transport. Secretary agreeable to call friend when [facility] MonsTAR arrives.  CSW prepared DC packet with FL2 and hard scripts included. MD rescinded IVC paperwork and Notice of Commitment Change form was faxed to the Magistrate. RN to call report to SNF. [facility] request #: [id number].  CSW is signing off but available if needed.  [place], [place] [phone]

## 2014-01-16 NOTE — Progress Notes (Signed)
Able to place clonidine patch on to pts left shoulder in the back.  SBP is 190.  Will monitor when pt allows it

## 2014-01-16 NOTE — Discharge Summary (Addendum)
Physician Discharge Summary  Joy BlowGertrude Loudenslager ZHY:[id number] DOB: [birth year] DOA: 01/12/2014  PCP: Marletta LorBarr, Julie, NP  Admit date: 01/12/2014 Discharge date: 01/16/2014  Recommendations for Outpatient Follow-up:  1. Pt will need to follow up with PCP in 2-3 weeks post discharge 2. Pt refusing blood work   Discharge Diagnoses: Acute renal failure, dehydration Active Problems:   Acute on chronic renal failure  Discharge Condition: Stable  Diet recommendation: Heart healthy diet discussed in details   Brief narrative:  80 y.o. female with PMH significant for Hypertension [facility] and involuntarily committed for further evaluation. Patient brought to the ER by police for evaluation of involuntary commitment.  In ED, she was found to have Cr at 2.5 from prior at 1.5, Hgb 8.8. ED physician performed rectal exam and was guaiac positive. [facility] asked to admit for further evaluation.   Active Problems:  Acute encephalopathy, unclear etiology but possibly related to acute stroke, pt is alert and oriented to name but not place or time. Seems to be having some visual hallucinations, picking at the air. Per friend, she has never been aggressive or used profanity until now but symptoms have persisted since admission  - CXR NAD  - UA neg  - CT with evidence of old infarct but no new disease  - Patient refusing MRI and most medications  - SW attempting to coordinate with niece about placement  Acute on chronic renal failure likely prerenal  - pt has been refusing IV access placement, no IVF, no blood work  - she is eating and drinking fluids this AM and tolerating well, will respect her wishes  Accelerated HTN  - Trial of clonidine patch  - continue oral medical regimen if pt willing to take  Anemia, acute blood loss  - uncler etiology but possibly lower GI bleed, FOBT positive  - last Hg in 04/2012 was WNL  - iron is low, placed on iron supplement but patient refusing   Leukocytosis  - appears to be reactive, UA and CXR unremarkable for acute events  - pt remains afebrile  Dementia  - remote infarction noted on CT head but pt refusing MRI  - Also refusing labs, so TSH, RPR, B12 not obtained   Consultants:  None  Procedures/Studies:  Dg Chest 2 View 01/12/2014 No acute cardiopulmonary abnormality seen.  Ct Head Wo Contrast 01/12/2014 Stable atrophy and diffuse white matter disease, no acute intracranial abnormality. Remote infarct of the right occipital lobe.  Antibiotics:  None Code Status: Full  Family Communication: Spoke with friend who as at bedside  Disposition Plan: Remains inpatient, pt involuntarily committed by police department, placement in progress, possibly dementia unit vs. SNF   Discharge Exam: Filed Vitals:   01/16/14 0510  BP: 194/72  Pulse: 77  Temp: 97.5 F (36.4 C)  Resp: 20   Filed Vitals:   01/13/14 1513 01/14/14 1502 01/15/14 1431 01/16/14 0510  BP: 184/72  175/71 194/72  Pulse: 81 92 76 77  Temp: 98.6 F (37 C) 98.2 F (36.8 C) 98.5 F (36.9 C) 97.5 F (36.4 C)  TempSrc: Axillary Axillary Axillary Oral  Resp: 20 20 20 20   SpO2: 96% 95% 97% 95%    General: Pt is alert, does not follow commands appropriately, not in acute distress Cardiovascular: Regular rate and rhythm, S1/S2 +, no murmurs, no rubs, no gallops Respiratory: Clear to auscultation bilaterally, no wheezing, no crackles, no rhonchi Abdominal: Soft, non tender, non distended, bowel sounds +, no guarding  Discharge Instructions  Discharge Orders   Future Orders Complete By Expires   Diet - low sodium heart healthy  As directed    Increase activity slowly  As directed        Medication List         aspirin EC 81 MG tablet  Take 81 mg by mouth daily.     cloNIDine 0.1 mg/24hr patch  Commonly known as:  CATAPRES - Dosed in mg/24 hr  Place 1 patch (0.1 mg total) onto the skin once a week.     ferrous sulfate 325 (65 FE) MG tablet  Take  1 tablet (325 mg total) by mouth 2 (two) times daily with a meal.     hydrALAZINE 25 MG tablet  Commonly known as:  APRESOLINE  Take 1 tablet (25 mg total) by mouth 3 (three) times daily.     senna 8.6 MG Tabs tablet  Commonly known as:  SENOKOT  Take 1 tablet by mouth daily.      Zyprexa 5 mg PO QD      Follow-up Information   Schedule an appointment as soon as possible for a visit with Marletta Lor, NP.   Specialty:  Nurse Practitioner   Contact information:   [facility] [address] [place] [address] [phone]        The results of significant diagnostics from this hospitalization (including imaging, microbiology, ancillary and laboratory) are listed below for reference.     Microbiology: Recent Results (from the past 240 hour(s))  URINE CULTURE     Status: None   Collection Time    01/12/14  2:50 PM      Result Value Ref Range Status   Specimen Description URINE, CLEAN CATCH   Final   Special Requests NONE   Final   Culture  Setup Time     Final   Value: 01/12/2014 22:40     Performed at [facility] Count     Final   Value: NO GROWTH     Performed at [facility]   Culture     Final   Value: NO GROWTH     Performed at [facility]   Report Status 01/13/2014 FINAL   Final     Labs: Basic Metabolic Panel:  Recent Labs Lab 01/12/14 1533  NA 143  K 4.2  CL 107  CO2 24  GLUCOSE 130*  BUN 24*  CREATININE 2.58*  CALCIUM 9.3   Liver Function Tests:  Recent Labs Lab 01/12/14 1533  AST 16  ALT 8  ALKPHOS 63  BILITOT <0.2*  PROT 6.9  ALBUMIN 3.0*   Recent Labs Lab 01/12/14 1533  AMMONIA 44   CBC:  Recent Labs Lab 01/12/14 1533  WBC 13.1*  NEUTROABS 10.6*  HGB 8.8*  HCT 26.2*  MCV 80.6  PLT 445*   Cardiac Enzymes:  Recent Labs Lab 01/12/14 1533  TROPONINI <0.30   SIGNED: Time coordinating discharge: Over 30 minutes  Debbora Presto, MD  [facility] 01/16/2014, 10:07 AM Pager [phone]  If 7PM-7AM, please contact night-coverage [email or web address] Password [email or web address]

## 2014-01-16 NOTE — Progress Notes (Signed)
Pt 's landlord in to help with pt. She helped the pt to eat and drink and also convinced her to take her medications. Pt definitely has trust in [name].

## 2014-01-16 NOTE — Consult Note (Signed)
[facility] Face-to-Face Psychiatry Consult   Reason for Consult:  [facility]   Referring Physician:  Dr Donn Pieriniegaldo  [name] is 80 y.o. female. 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 Heart murmur   . Hypertension   . Dizziness 04/2012  . Fall    AXIS IV:  other psychosocial or environmental problems, problems related to social environment and problems with primary support group AXIS V:  21-30 behavior considerably influenced by delusions or hallucinations OR serious impairment in judgment, communication OR inability to function in almost all areas  Plan:  Supportive therapy provided about ongoing stressors. Medication management  Subjective:   [name] is a 80 y.o. female patient admitted with change in her mental status.  Patient has called multiple times to without any clear reason.  When EMS arrived at her home she refused to get him in the ambulance.[name].  HPI:  Patient seen chart reviewed.  The patient is very irritable agitated and confused.  She refused to talk.  She was noticed trying to take her clothes off.  She was also trying to get out of bed.  She was using profanity and appears very confused.  As per staff, she has been very irritable, unable to sleep and refusing IV fluids.  Patient has history of hypertension but no known history of psychiatric illness.  Patient lives by herself.  She is unable to provide any information.  Her landlord was visiting explained that patient lives by herself for more than 7 years at her current place.  She has few relatives who live out of state.  As per landlord patient is experiencing an erratic behavior for past few weeks.  Patient denies any suicidal thoughts or homicidal thoughts.  She is very upset because she does not believe she has any illness.  She  appears responding to internal stimuli.  She is delusional and confused.  She was noticed talking to herself with pressure speech.   HPI Elements:   Location:  Confusion, hallucination. Quality:  Unable to function. Severity:  Moderate.  Past Psychiatric History: Past Medical History  Diagnosis Date  . Heart murmur   . Hypertension   . Dizziness 04/2012  . Fall     reports that she quit smoking about 44 years ago. Her smoking use included Cigarettes. She smoked 0.00 packs per day. She has never used smokeless tobacco. She reports that she does not drink alcohol or use illicit drugs. Family History  Problem Relation Age of Onset  . Uterine cancer Sister      Living Arrangements: Alone   Abuse/Neglect [facility]) Physical Abuse: Denies Verbal Abuse: Denies Sexual Abuse: Denies Allergies:  No Known Allergies  ACT Assessment Complete:  No:   Past Psychiatric History: As per chart patient has no known history of psychiatric illness.  Place of Residence:  Lives alone Marital Status:  Widowed Employed/Unemployed:  Unemployed Education:  Unknown Family Supports:  Limited Objective: Blood pressure 194/72, pulse 77, temperature 97.5 F (36.4 C), temperature source Oral, resp. rate 20, SpO2 95.00%.There is no weight on file to calculate BMI.No results found for this or any previous visit (from the past 72 hour(s)). Labs are reviewed.  Current Facility-Administered Medications  Medication Dose Route Frequency Provider Last Rate Last Dose  . acetaminophen (TYLENOL) tablet 650 mg  650 mg  Oral Q6H PRN Belkys A Regalado, MD       Or  . acetaminophen (TYLENOL) suppository 650 mg  650 mg Rectal Q6H PRN Belkys A Regalado, MD      . aspirin EC tablet 81 mg  81 mg Oral Daily Belkys A Regalado, MD   81 mg at 01/16/14 1041  . cloNIDine (CATAPRES - Dosed in mg/24 hr) patch 0.1 mg  0.1 mg Transdermal Weekly Renae Fickle, MD   0.1 mg at 01/16/14 0529  . feeding supplement (ENSURE COMPLETE) (ENSURE  COMPLETE) liquid 237 mL  237 mL Oral BID BM Earna Coder, RD   237 mL at 01/16/14 1040  . ferrous sulfate tablet 325 mg  325 mg Oral BID WC Dorothea Ogle, MD   325 mg at 01/16/14 1040  . haloperidol lactate (HALDOL) injection 1-2 mg  1-2 mg Intramuscular Q6H PRN Renae Fickle, MD   2 mg at 01/16/14 0544  . hydrALAZINE (APRESOLINE) tablet 25 mg  25 mg Oral Q6H PRN Renae Fickle, MD      . ondansetron (ZOFRAN) tablet 4 mg  4 mg Oral Q6H PRN Belkys A Regalado, MD       Or  . ondansetron (ZOFRAN) injection 4 mg  4 mg Intravenous Q6H PRN Belkys A Regalado, MD      . pantoprazole (PROTONIX) EC tablet 40 mg  40 mg Oral Daily Dorothea Ogle, MD   40 mg at 01/16/14 1041  . senna (SENOKOT) tablet 8.6 mg  1 tablet Oral Daily Belkys A Regalado, MD   8.6 mg at 01/16/14 1041    Psychiatric Specialty Exam:     Blood pressure 194/72, pulse 77, temperature 97.5 F (36.4 C), temperature source Oral, resp. rate 20, SpO2 95.00%.There is no weight on file to calculate BMI.  General Appearance: Disheveled and Confused and agitated  Eye Contact::  Poor  Speech:  Pressured and Rambling  Volume:  Increased  Mood:  Angry and Irritable  Affect:  Inappropriate and Labile  Thought Process:  Circumstantial, Disorganized and Loose  Orientation:  Other:  Confused  Thought Content:  Delusions and Hallucinations: Talking to herself  Suicidal Thoughts:  No  Homicidal Thoughts:  No  Memory:  Immediate;   Poor Recent;   Poor Remote;   Poor  Judgement:  Impaired  Insight:  Lacking  Psychomotor Activity:  Increased  Concentration:  Poor  Recall:  Poor  Fund of Knowledge:Poor  Language: Poor  Akathisia:  No  Handed:  Right  AIMS (if indicated):     Assets:  Housing  Sleep:      Musculoskeletal: Strength & Muscle Tone: within normal limits Gait & Station: unsteady Patient leans: N/A  Treatment Plan Summary: Medication management, start Zyprexa Zydis 5 mg when necessary by mouth for mild agitation and  psychosis and Haldol 2-5 mg IM for severe agitation and aggressive behavior if not medically contraindicated.  Patient required sitters for safety .    Yarissa Reining T. 01/16/2014 1:12 PM
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
## 2014-01-17 ENCOUNTER — Encounter: Payer: Self-pay | Admitting: Internal Medicine

## 2014-01-17 ENCOUNTER — Non-Acute Institutional Stay (SKILLED_NURSING_FACILITY): Payer: Medicare Other | Admitting: Internal Medicine

## 2014-01-17 DIAGNOSIS — D649 Anemia, unspecified: Secondary | ICD-10-CM | POA: Insufficient documentation

## 2014-01-17 DIAGNOSIS — I10 Essential (primary) hypertension: Secondary | ICD-10-CM

## 2014-01-17 DIAGNOSIS — F0391 Unspecified dementia with behavioral disturbance: Secondary | ICD-10-CM

## 2014-01-17 DIAGNOSIS — N189 Chronic kidney disease, unspecified: Secondary | ICD-10-CM

## 2014-01-17 DIAGNOSIS — N179 Acute kidney failure, unspecified: Secondary | ICD-10-CM

## 2014-01-17 DIAGNOSIS — F03918 Unspecified dementia, unspecified severity, with other behavioral disturbance: Secondary | ICD-10-CM | POA: Insufficient documentation

## 2014-01-17 DIAGNOSIS — G9340 Encephalopathy, unspecified: Secondary | ICD-10-CM

## 2014-01-17 DIAGNOSIS — F29 Unspecified psychosis not due to a substance or known physiological condition: Secondary | ICD-10-CM | POA: Insufficient documentation

## 2014-01-17 NOTE — Assessment & Plan Note (Signed)
prob pre-renal;pt refused IV but was eating and drinking on her own

## 2014-01-17 NOTE — Assessment & Plan Note (Signed)
Pt had visual hallucinations and is still having them;pt was started on zyprexa 5 mg daily
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
## 2014-01-17 NOTE — Assessment & Plan Note (Signed)
Presentation to the hospital under inv commit papers for AMS;pt was having visual hallucinations, aggression and profanity; studies for acute etiology including stroke were neg;pt had CT but refused MRI

## 2014-01-17 NOTE — Assessment & Plan Note (Signed)
Accelerated on presentation to ED;Clonidine patch was started and continue orals if pt will take them

## 2014-01-17 NOTE — Assessment & Plan Note (Signed)
Remote infarct on CT, pt refused MRI, TSH, RPR, B12

## 2014-01-17 NOTE — Assessment & Plan Note (Signed)
Possibly acute blood loss with + FOBT, unclear etiology, w/u refussal ;Hb 04/2012 was nl;during hosp was 8.8; pt started on iron supp,pt refused

## 2014-01-21 ENCOUNTER — Emergency Department (HOSPITAL_COMMUNITY): Payer: Medicare Other

## 2014-01-21 ENCOUNTER — Inpatient Hospital Stay (HOSPITAL_COMMUNITY)
Admission: EM | Admit: 2014-01-21 | Discharge: 2014-02-22 | DRG: 682 | Disposition: E | Payer: Medicare Other | Attending: Family Medicine | Admitting: Family Medicine

## 2014-01-21 ENCOUNTER — Encounter (HOSPITAL_COMMUNITY): Payer: Self-pay | Admitting: Emergency Medicine

## 2014-01-21 DIAGNOSIS — I120 Hypertensive chronic kidney disease with stage 5 chronic kidney disease or end stage renal disease: Secondary | ICD-10-CM | POA: Diagnosis present

## 2014-01-21 DIAGNOSIS — E119 Type 2 diabetes mellitus without complications: Secondary | ICD-10-CM | POA: Diagnosis present

## 2014-01-21 DIAGNOSIS — E870 Hyperosmolality and hypernatremia: Secondary | ICD-10-CM

## 2014-01-21 DIAGNOSIS — M1990 Unspecified osteoarthritis, unspecified site: Secondary | ICD-10-CM | POA: Diagnosis present

## 2014-01-21 DIAGNOSIS — G9340 Encephalopathy, unspecified: Secondary | ICD-10-CM

## 2014-01-21 DIAGNOSIS — Z7982 Long term (current) use of aspirin: Secondary | ICD-10-CM

## 2014-01-21 DIAGNOSIS — F0391 Unspecified dementia with behavioral disturbance: Secondary | ICD-10-CM

## 2014-01-21 DIAGNOSIS — N189 Chronic kidney disease, unspecified: Secondary | ICD-10-CM

## 2014-01-21 DIAGNOSIS — N179 Acute kidney failure, unspecified: Secondary | ICD-10-CM

## 2014-01-21 DIAGNOSIS — R1310 Dysphagia, unspecified: Secondary | ICD-10-CM | POA: Diagnosis not present

## 2014-01-21 DIAGNOSIS — F29 Unspecified psychosis not due to a substance or known physiological condition: Secondary | ICD-10-CM

## 2014-01-21 DIAGNOSIS — Z87891 Personal history of nicotine dependence: Secondary | ICD-10-CM

## 2014-01-21 DIAGNOSIS — J189 Pneumonia, unspecified organism: Secondary | ICD-10-CM | POA: Diagnosis present

## 2014-01-21 DIAGNOSIS — N186 End stage renal disease: Secondary | ICD-10-CM | POA: Diagnosis present

## 2014-01-21 DIAGNOSIS — R0902 Hypoxemia: Secondary | ICD-10-CM

## 2014-01-21 DIAGNOSIS — R5381 Other malaise: Secondary | ICD-10-CM | POA: Diagnosis present

## 2014-01-21 DIAGNOSIS — R011 Cardiac murmur, unspecified: Secondary | ICD-10-CM | POA: Diagnosis present

## 2014-01-21 DIAGNOSIS — Z515 Encounter for palliative care: Secondary | ICD-10-CM

## 2014-01-21 DIAGNOSIS — F03918 Unspecified dementia, unspecified severity, with other behavioral disturbance: Secondary | ICD-10-CM

## 2014-01-21 DIAGNOSIS — J9600 Acute respiratory failure, unspecified whether with hypoxia or hypercapnia: Secondary | ICD-10-CM

## 2014-01-21 DIAGNOSIS — E440 Moderate protein-calorie malnutrition: Secondary | ICD-10-CM | POA: Diagnosis present

## 2014-01-21 DIAGNOSIS — Z79899 Other long term (current) drug therapy: Secondary | ICD-10-CM

## 2014-01-21 DIAGNOSIS — I10 Essential (primary) hypertension: Secondary | ICD-10-CM | POA: Diagnosis present

## 2014-01-21 DIAGNOSIS — Z8049 Family history of malignant neoplasm of other genital organs: Secondary | ICD-10-CM

## 2014-01-21 DIAGNOSIS — Z66 Do not resuscitate: Secondary | ICD-10-CM | POA: Diagnosis present

## 2014-01-21 HISTORY — DX: Unspecified dementia, unspecified severity, without behavioral disturbance, psychotic disturbance, mood disturbance, and anxiety: F03.90

## 2014-01-21 LAB — URINALYSIS, ROUTINE W REFLEX MICROSCOPIC
GLUCOSE, UA: NEGATIVE mg/dL
Ketones, ur: NEGATIVE mg/dL
Leukocytes, UA: NEGATIVE
Nitrite: NEGATIVE
PH: 5.5 (ref 5.0–8.0)
Protein, ur: >300 mg/dL — AB
SPECIFIC GRAVITY, URINE: 1.016 (ref 1.005–1.030)
Urobilinogen, UA: 0.2 mg/dL (ref 0.0–1.0)

## 2014-01-21 LAB — CBC WITH DIFFERENTIAL/PLATELET
Basophils Absolute: 0 10*3/uL (ref 0.0–0.1)
Basophils Relative: 0 % (ref 0–1)
Eosinophils Absolute: 0 10*3/uL (ref 0.0–0.7)
Eosinophils Relative: 0 % (ref 0–5)
HCT: 32.3 % — ABNORMAL LOW (ref 36.0–46.0)
Hemoglobin: 10.3 g/dL — ABNORMAL LOW (ref 12.0–15.0)
LYMPHS ABS: 1.2 10*3/uL (ref 0.7–4.0)
Lymphocytes Relative: 5 % — ABNORMAL LOW (ref 12–46)
MCH: 26.3 pg (ref 26.0–34.0)
MCHC: 31.9 g/dL (ref 30.0–36.0)
MCV: 82.4 fL (ref 78.0–100.0)
Monocytes Absolute: 1.3 10*3/uL — ABNORMAL HIGH (ref 0.1–1.0)
Monocytes Relative: 6 % (ref 3–12)
NEUTROS PCT: 89 % — AB (ref 43–77)
Neutro Abs: 20.8 10*3/uL — ABNORMAL HIGH (ref 1.7–7.7)
Platelets: 443 10*3/uL — ABNORMAL HIGH (ref 150–400)
RBC: 3.92 MIL/uL (ref 3.87–5.11)
RDW: 16.2 % — ABNORMAL HIGH (ref 11.5–15.5)
WBC: 23.3 10*3/uL — AB (ref 4.0–10.5)

## 2014-01-21 LAB — BLOOD GAS, ARTERIAL
ACID-BASE DEFICIT: 10.2 mmol/L — AB (ref 0.0–2.0)
Bicarbonate: 14.5 mEq/L — ABNORMAL LOW (ref 20.0–24.0)
Drawn by: 11249
O2 CONTENT: 6 L/min
O2 Saturation: 90.8 %
PATIENT TEMPERATURE: 98.6
TCO2: 13.7 mmol/L (ref 0–100)
pCO2 arterial: 29.6 mmHg — ABNORMAL LOW (ref 35.0–45.0)
pH, Arterial: 7.312 — ABNORMAL LOW (ref 7.350–7.450)
pO2, Arterial: 66.1 mmHg — ABNORMAL LOW (ref 80.0–100.0)

## 2014-01-21 LAB — COMPREHENSIVE METABOLIC PANEL
ALBUMIN: 3.3 g/dL — AB (ref 3.5–5.2)
ALK PHOS: 64 U/L (ref 39–117)
ALT: 76 U/L — ABNORMAL HIGH (ref 0–35)
AST: 122 U/L — ABNORMAL HIGH (ref 0–37)
BUN: 84 mg/dL — ABNORMAL HIGH (ref 6–23)
CO2: 18 mEq/L — ABNORMAL LOW (ref 19–32)
Calcium: 9.1 mg/dL (ref 8.4–10.5)
Chloride: 123 mEq/L — ABNORMAL HIGH (ref 96–112)
Creatinine, Ser: 5.47 mg/dL — ABNORMAL HIGH (ref 0.50–1.10)
GFR calc non Af Amer: 6 mL/min — ABNORMAL LOW (ref >90)
GFR, EST AFRICAN AMERICAN: 7 mL/min — AB (ref >90)
GLUCOSE: 131 mg/dL — AB (ref 70–99)
Potassium: 5 mEq/L (ref 3.7–5.3)
SODIUM: 163 meq/L — AB (ref 137–147)
Total Bilirubin: 0.5 mg/dL (ref 0.3–1.2)
Total Protein: 6.9 g/dL (ref 6.0–8.3)

## 2014-01-21 LAB — INFLUENZA PANEL BY PCR (TYPE A & B)
H1N1 flu by pcr: NOT DETECTED
INFLAPCR: NEGATIVE
INFLBPCR: NEGATIVE

## 2014-01-21 LAB — URINE MICROSCOPIC-ADD ON

## 2014-01-21 LAB — I-STAT CG4 LACTIC ACID, ED: Lactic Acid, Venous: 1.67 mmol/L (ref 0.5–2.2)

## 2014-01-21 LAB — MRSA PCR SCREENING: MRSA by PCR: NEGATIVE

## 2014-01-21 MED ORDER — OLANZAPINE 5 MG PO TABS
5.0000 mg | ORAL_TABLET | Freq: Every day | ORAL | Status: DC
  Filled 2014-01-21 (×2): qty 1

## 2014-01-21 MED ORDER — DEXTROSE 5 % IV SOLN
1.0000 g | Freq: Once | INTRAVENOUS | Status: AC
  Administered 2014-01-21: 1 g via INTRAVENOUS
  Filled 2014-01-21: qty 1

## 2014-01-21 MED ORDER — HEPARIN SODIUM (PORCINE) 5000 UNIT/ML IJ SOLN
5000.0000 [IU] | Freq: Three times a day (TID) | INTRAMUSCULAR | Status: DC
Start: 2014-01-21 — End: 2014-01-22
  Administered 2014-01-21 – 2014-01-22 (×2): 5000 [IU] via SUBCUTANEOUS
  Filled 2014-01-21 (×5): qty 1

## 2014-01-21 MED ORDER — LORAZEPAM 2 MG/ML IJ SOLN: 2.0000 mg | INTRAMUSCULAR | Status: DC | PRN

## 2014-01-21 MED ORDER — ONDANSETRON HCL 4 MG/2ML IJ SOLN: 4.0000 mg | Freq: Four times a day (QID) | INTRAMUSCULAR | Status: DC | PRN

## 2014-01-21 MED ORDER — ONDANSETRON HCL 4 MG PO TABS: 4.0000 mg | ORAL_TABLET | Freq: Four times a day (QID) | ORAL | Status: DC | PRN

## 2014-01-21 MED ORDER — DEXTROSE 5 % IV SOLN
500.0000 g | INTRAVENOUS | Status: DC
  Filled 2014-01-21: qty 500

## 2014-01-21 MED ORDER — SODIUM CHLORIDE 0.9 % IV BOLUS (SEPSIS): 1000.0000 mL | Freq: Once | INTRAVENOUS | Status: DC

## 2014-01-21 MED ORDER — CLONIDINE HCL 0.1 MG/24HR TD PTWK: 0.1000 mg | MEDICATED_PATCH | TRANSDERMAL | Status: DC

## 2014-01-21 MED ORDER — HALOPERIDOL LACTATE 5 MG/ML IJ SOLN
1.0000 mg | Freq: Once | INTRAMUSCULAR | Status: AC
  Administered 2014-01-21: 13:00:00 via INTRAVENOUS
  Filled 2014-01-21: qty 1

## 2014-01-21 MED ORDER — ASPIRIN EC 81 MG PO TBEC
81.0000 mg | DELAYED_RELEASE_TABLET | Freq: Every day | ORAL | Status: DC
  Filled 2014-01-21 (×3): qty 1

## 2014-01-21 MED ORDER — DEXTROSE-NACL 5-0.45 % IV SOLN
INTRAVENOUS | Status: DC
  Administered 2014-01-21 – 2014-01-22 (×3): via INTRAVENOUS

## 2014-01-21 MED ORDER — VANCOMYCIN HCL 10 G IV SOLR
1500.0000 mg | Freq: Once | INTRAVENOUS | Status: AC
  Administered 2014-01-21: 1500 mg via INTRAVENOUS
  Filled 2014-01-21: qty 1500

## 2014-01-21 NOTE — Progress Notes (Signed)
RN called RT to look at patient to see what the RT thought. RN called the NP to come and see pt. NP ordered an ABG. ABG results were pH 7.31 , CO2 was 29.6, and O2 was 66.1. RT gave result to RN. No panics.

## 2014-01-21 NOTE — ED Notes (Signed)
Pt's daughter reports AMS, pt's norm is alert and oriented.  Pt at present is confused. Mumbles "thank you" at times.  Does not follow commands.  Pt hits, kicks and grabs staff.
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
## 2014-01-21 NOTE — Progress Notes (Signed)
Patient had change in mental status and decreased level of consciousness.  MD notified and orders received.  Will continue to monitor.

## 2014-01-21 NOTE — Progress Notes (Signed)
PCP:   Alvester Chou, NP   Chief Complaint:  Altered mental status  HPI: 80 year old female who was discharged [facility] 2/[facility] after she was treated for altered mental status and acute renal failure. Patient was constantly refusing the treatments while she was the hospital. As per patient's neighbors, who are her caregivers patient was fine on the day of discharge, but then started to go downhill as she was not eating and drinking over the past few days and was very lethargic. Patient's care have been somewhat limited due to constant refusal for treatments as well as agitation. She also has been having cough with shortness of breath over the past 3 days. In the ED patient was found to be in acute kidney injury with worsening creatinine today of 5.47. Her sodium is 163, BUN 84 with CO2 of 18. Patient also has elevated white count of 23,000. And she has been started on vancomycin and cefepime empirically for healthcare associated pneumonia. Patient received Haldol in the ED as she was agitated and is unable to provide any meaningful history.  Allergies:  No Known Allergies    Past Medical History  Diagnosis Date  . Heart murmur   . Hypertension   . Dizziness 04/2012  . Fall   . Dementia     Past Surgical History  Procedure Laterality Date  . Abdominal hysterectomy    . Leg surgery      pt states has a rod in right leg    Prior to Admission medications   Medication Sig Start Date End Date Taking? Authorizing Provider  aspirin EC 81 MG tablet Take 81 mg by mouth daily.   Yes Historical Provider, MD  cloNIDine (CATAPRES - DOSED IN MG/24 HR) 0.1 mg/24hr patch Place 1 patch (0.1 mg total) onto the skin once a week. 01/16/14  Yes Theodis Blaze, MD  ferrous sulfate 325 (65 FE) MG tablet Take 1 tablet (325 mg total) by mouth 2 (two) times daily with a meal. 01/16/14  Yes Theodis Blaze, MD  hydrALAZINE (APRESOLINE) 25 MG tablet Take 1 tablet (25 mg total) by mouth 3 (three) times  daily. 01/16/14  Yes Theodis Blaze, MD  senna (SENOKOT) 8.6 MG TABS tablet Take 1 tablet by mouth daily.   Yes Historical Provider, MD  OLANZapine (ZYPREXA) 5 MG tablet Take 1 tablet (5 mg total) by mouth at bedtime. 01/16/14   Theodis Blaze, MD    Social History:  reports that she quit smoking about 44 years ago. Her smoking use included Cigarettes. She smoked 0.00 packs per day. She has never used smokeless tobacco. She reports that she does not drink alcohol or use illicit drugs.  Family History  Problem Relation Age of Onset  . Uterine cancer Sister      All the positives are listed in BOLD  Review of Systems:  I would to obtain due to patient's altered mental status   Physical Exam: Blood pressure 131/32, pulse 77, temperature 99.8 F (37.7 C), temperature source Rectal, resp. rate 23, weight 81.647 kg (180 lb), SpO2 100.00%. Constitutional:   Patient appears agitated though she is somnolent. Head: Normocephalic and atraumatic Mouth: Mucus membranes moist Eyes: PERRL, EOMI, conjunctivae normal Neck: Supple, No Thyromegaly Cardiovascular: RRR, S1 normal, S2 normal Pulmonary/Chest: CTAB, no wheezes, rales, or rhonchi Abdominal: Soft. Non-tender, non-distended, bowel sounds are normal, no masses, organomegaly, or guarding present.  Neurological: Somnolent, moving all extremities.  Extremities : No Cyanosis, Clubbing or  Edema   Labs on Admission:  Results for orders placed during the hospital encounter of 12/29/2013 (from the past 48 hour(s))  URINALYSIS, ROUTINE W REFLEX MICROSCOPIC     Status: Abnormal   Collection Time    01/02/2014 12:08 PM      Result Value Ref Range   Color, Urine YELLOW  YELLOW   APPearance CLOUDY (*) CLEAR   Specific Gravity, Urine 1.016  1.005 - 1.030   pH 5.5  5.0 - 8.0   Glucose, UA NEGATIVE  NEGATIVE mg/dL   Hgb urine dipstick MODERATE (*) NEGATIVE   Bilirubin Urine SMALL (*) NEGATIVE   Ketones, ur NEGATIVE  NEGATIVE mg/dL   Protein, ur >300  (*) NEGATIVE mg/dL   Urobilinogen, UA 0.2  0.0 - 1.0 mg/dL   Nitrite NEGATIVE  NEGATIVE   Leukocytes, UA NEGATIVE  NEGATIVE  URINE MICROSCOPIC-ADD ON     Status: Abnormal   Collection Time    12/27/2013 12:08 PM      Result Value Ref Range   WBC, UA 0-2  <3 WBC/hpf   RBC / HPF 3-6  <3 RBC/hpf   Bacteria, UA FEW (*) RARE   Casts HYALINE CASTS (*) NEGATIVE   Comment: GRANULAR CAST  CBC WITH DIFFERENTIAL     Status: Abnormal   Collection Time    01/12/2014 12:10 PM      Result Value Ref Range   WBC 23.3 (*) 4.0 - 10.5 K/uL   RBC 3.92  3.87 - 5.11 MIL/uL   Hemoglobin 10.3 (*) 12.0 - 15.0 g/dL   HCT 32.3 (*) 36.0 - 46.0 %   MCV 82.4  78.0 - 100.0 fL   MCH 26.3  26.0 - 34.0 pg   MCHC 31.9  30.0 - 36.0 g/dL   RDW 16.2 (*) 11.5 - 15.5 %   Platelets 443 (*) 150 - 400 K/uL   Neutrophils Relative % 89 (*) 43 - 77 %   Neutro Abs 20.8 (*) 1.7 - 7.7 K/uL   Lymphocytes Relative 5 (*) 12 - 46 %   Lymphs Abs 1.2  0.7 - 4.0 K/uL   Monocytes Relative 6  3 - 12 %   Monocytes Absolute 1.3 (*) 0.1 - 1.0 K/uL   Eosinophils Relative 0  0 - 5 %   Eosinophils Absolute 0.0  0.0 - 0.7 K/uL   Basophils Relative 0  0 - 1 %   Basophils Absolute 0.0  0.0 - 0.1 K/uL  COMPREHENSIVE METABOLIC PANEL     Status: Abnormal   Collection Time    01/16/2014 12:10 PM      Result Value Ref Range   Sodium 163 (*) 137 - 147 mEq/L   Comment: CRITICAL RESULT CALLED TO, READ BACK BY AND VERIFIED WITH:     M TEUP AT 1332 ON 02.28.2015 BY NBROOKS   Potassium 5.0  3.7 - 5.3 mEq/L   Chloride 123 (*) 96 - 112 mEq/L   CO2 18 (*) 19 - 32 mEq/L   Glucose, Bld 131 (*) 70 - 99 mg/dL   BUN 84 (*) 6 - 23 mg/dL   Creatinine, Ser 5.47 (*) 0.50 - 1.10 mg/dL   Calcium 9.1  8.4 - 10.5 mg/dL   Total Protein 6.9  6.0 - 8.3 g/dL   Albumin 3.3 (*) 3.5 - 5.2 g/dL   AST 122 (*) 0 - 37 U/L   ALT 76 (*) 0 - 35 U/L   Alkaline Phosphatase 64  39 - 117 U/L  Total Bilirubin 0.5  0.3 - 1.2 mg/dL   GFR calc non Af Amer 6 (*) >90 mL/min   GFR  calc Af Amer 7 (*) >90 mL/min   Comment: (NOTE)     The eGFR has been calculated using the CKD EPI equation.     This calculation has not been validated in all clinical situations.     eGFR's persistently <90 mL/min signify possible Chronic Kidney     Disease.  I-STAT CG4 LACTIC ACID, ED     Status: None   Collection Time    01/20/2014 12:22 PM      Result Value Ref Range   Lactic Acid, Venous 1.67  0.5 - 2.2 mmol/L    Radiological Exams on Admission: Ct Head Wo Contrast  01/05/2014   CLINICAL DATA:  Change in behavior  EXAM: CT HEAD WITHOUT CONTRAST  TECHNIQUE: Contiguous axial images were obtained from the base of the skull through the vertex without intravenous contrast.  COMPARISON:  01/12/2014  FINDINGS: No skull fracture is noted. Paranasal sinuses and mastoid air cells are unremarkable. No intracranial hemorrhage, mass effect or midline shift.  No acute cortical infarction. Stable cerebral atrophy. Stable periventricular and patchy subcortical chronic white matter decreased attenuation consistent with chronic small vessel ischemic changes. No mass lesion is noted on this unenhanced scan.  IMPRESSION: No acute intracranial abnormality. Stable atrophy and chronic white matter disease.   Electronically Signed   By: Lahoma Crocker M.D.   On: 01/01/2014 13:36   Dg Chest Port 1 View  01/10/2014   CLINICAL DATA:  Altered mental status  EXAM: PORTABLE CHEST - 1 VIEW  COMPARISON:  DG CHEST 2 VIEW dated 01/12/2014; DG THORACIC SPINE dated 04/21/2012; DG CHEST 2 VIEW dated 09/11/2009  FINDINGS: Stable mild cardiac enlargement. Vascular pattern is normal. No evidence of consolidation, with limited evaluation of the retrocardiac area on this single frontal view of the chest. Mild to moderate left and moderate to severe right glenohumeral degenerative change noted.  IMPRESSION: Stable mild cardiac enlargement.  No acute findings.   Electronically Signed   By: Skipper Cliche M.D.   On: 12/30/2013 12:26     Assessment/Plan Principal Problem:   AKI (acute kidney injury) Active Problems:   HTN (hypertension)   Acute encephalopathy   Dementia with behavioral disturbance   Hypernatremia  Altered mental status Multifactorial, patient has underlying dementia, was seen by psych in previous admission for psychosis. Now she has worsening renal failure, and probably her mental status has worsened. We'll treat the renal failure and hopefully her mental status will improve and back to baseline. Patient also has elevated white count and has been having shortness of breath over past 3 days. Chest x-ray does not show pneumonia but will continue vancomycin and cefepime empirically at this time for possible pneumonia. UA has been clear.  Acute kidney injury Patient has worsening of creatinine from 2.58 to 5.47, will start her on D5 half normal saline as she also has severe hypernatremia with sodium of 163. She will be seen by nephrology in consultation. Called and discussed with Dr. Melvia Heaps.  Hypernatremia Sodium is 163, will start D5 half normal saline at 100 per hour. Follow sodium and the morning  Hypertension Continue Catapres patch Blood pressure is stable  CODE STATUS- unclear power of attorney, discussed with patient's neighbors Bettye Boeck and Chipper Oman, at this time patient is full code.  Family discussion: Discussed with patient's neighbors who are caregivers, Also called the niece Mardene Celeste  Darrick Meigs [phone], and discussed the current medical situation and poor prognoses. She agrees to talk to palliative care. Will get the palliative care consult.  DSS social worker- Purcell Mouton [phone]   Time Spent on Admission: 70 minutes  [facility] Pager: [phone] 01/04/2014, 3:16 PM  If 7PM-7AM, please contact night-coverage  [email or web address]  Password [email or web address]

## 2014-01-21 NOTE — ED Notes (Signed)
MD at bedside.  hospitalist 

## 2014-01-21 NOTE — ED Provider Notes (Signed)
CSN: [id number]     Arrival date & time 12/31/2013  1137 History   First MD Initiated Contact with Patient 12/28/2013 1147     Chief Complaint  Patient presents with  . Altered Mental Status     (Consider location/radiation/quality/duration/timing/severity/associated sxs/prior Treatment) HPI Comments: 80 year old female presents by EMS for altered mental status. The patient is altered and cannot provide a history, so the history taken from a good friend and neighbor. They relayed these are her closest friends that she does not have any close relatives in the area. The patient has dementia over the past one to 2 weeks she been having confusion worse than baseline and agitation. She was admitted here last week was unable to get an MRI do to refusing care and agitation. They're unable to give her IV fluids for her dehydration. As seems below worse is also having a cough productive of sputum and shortness of breath over last 3 days.   Past Medical History  Diagnosis Date  . Heart murmur   . Hypertension   . Dizziness 04/2012  . Fall   . Dementia    Past Surgical History  Procedure Laterality Date  . Abdominal hysterectomy    . Leg surgery      pt states has a rod in right leg   Family History  Problem Relation Age of Onset  . Uterine cancer Sister    History  Substance Use Topics  . Smoking status: Former Smoker    Types: Cigarettes    Quit date: 04/21/1969  . Smokeless tobacco: Never Used  . Alcohol Use: No   OB History   Grav Para Term Preterm Abortions TAB SAB Ect Mult Living                 Review of Systems  Unable to perform ROS: Mental status change      Allergies  Review of patient's allergies indicates no known allergies.  Home Medications   Current Outpatient Rx  Name  Route  Sig  Dispense  Refill  . aspirin EC 81 MG tablet   Oral   Take 81 mg by mouth daily.         . cloNIDine (CATAPRES - DOSED IN MG/24 HR) 0.1 mg/24hr patch   Transdermal   Place 1  patch (0.1 mg total) onto the skin once a week.   4 patch   12   . ferrous sulfate 325 (65 FE) MG tablet   Oral   Take 1 tablet (325 mg total) by mouth 2 (two) times daily with a meal.   60 tablet   3   . hydrALAZINE (APRESOLINE) 25 MG tablet   Oral   Take 1 tablet (25 mg total) by mouth 3 (three) times daily.   90 tablet   3   . senna (SENOKOT) 8.6 MG TABS tablet   Oral   Take 1 tablet by mouth daily.         [name]. OLANZapine (ZYPREXA) 5 MG tablet   Oral   Take 1 tablet (5 mg total) by mouth at bedtime.   30 tablet   1    Pulse 88  Temp(Src) 99.8 F (37.7 C) (Rectal)  SpO2 85% Physical Exam  Nursing note and vitals reviewed. Constitutional: She appears well-developed and well-nourished.  HENT:  Head: Normocephalic and atraumatic.  Right Ear: External ear normal.  Left Ear: External ear normal.  Nose: Nose normal.  Dry mucous membranes  Eyes: Right eye exhibits no  discharge. Left eye exhibits no discharge.  Cardiovascular: Normal rate, regular rhythm and normal heart sounds.   Pulmonary/Chest: Effort normal. She has rhonchi.  Abdominal: Soft. There is no tenderness.  Neurological: She is alert. She is disoriented.  Grossly moves all 4 extremities, does not follow commands  Skin: Skin is warm and dry.    ED Course  Procedures (including critical care time) Labs Review Labs Reviewed  CBC WITH DIFFERENTIAL - Abnormal; Notable for the following:    WBC 23.3 (*)    Hemoglobin 10.3 (*)    HCT 32.3 (*)    RDW 16.2 (*)    Platelets 443 (*)    Neutrophils Relative % 89 (*)    Neutro Abs 20.8 (*)    Lymphocytes Relative 5 (*)    Monocytes Absolute 1.3 (*)    All other components within normal limits  COMPREHENSIVE METABOLIC PANEL - Abnormal; Notable for the following:    Sodium 163 (*)    Chloride 123 (*)    CO2 18 (*)    Glucose, Bld 131 (*)    BUN 84 (*)    Creatinine, Ser 5.47 (*)    Albumin 3.3 (*)    AST 122 (*)    ALT 76 (*)    GFR calc non Af Amer  6 (*)    GFR calc Af Amer 7 (*)    All other components within normal limits  URINALYSIS, ROUTINE W REFLEX MICROSCOPIC - Abnormal; Notable for the following:    APPearance CLOUDY (*)    Hgb urine dipstick MODERATE (*)    Bilirubin Urine SMALL (*)    Protein, ur >300 (*)    All other components within normal limits  URINE MICROSCOPIC-ADD ON - Abnormal; Notable for the following:    Bacteria, UA FEW (*)    Casts HYALINE CASTS (*)    All other components within normal limits  CULTURE, BLOOD (ROUTINE X 2)  CULTURE, BLOOD (ROUTINE X 2)  URINE CULTURE  INFLUENZA PANEL BY PCR (TYPE A & B, H1N1)  I-STAT CG4 LACTIC ACID, ED   Imaging Review Ct Head Wo Contrast  01/08/2014   CLINICAL DATA:  Change in behavior  EXAM: CT HEAD WITHOUT CONTRAST  TECHNIQUE: Contiguous axial images were obtained from the base of the skull through the vertex without intravenous contrast.  COMPARISON:  01/12/2014  FINDINGS: No skull fracture is noted. Paranasal sinuses and mastoid air cells are unremarkable. No intracranial hemorrhage, mass effect or midline shift.  No acute cortical infarction. Stable cerebral atrophy. Stable periventricular and patchy subcortical chronic white matter decreased attenuation consistent with chronic small vessel ischemic changes. No mass lesion is noted on this unenhanced scan.  IMPRESSION: No acute intracranial abnormality. Stable atrophy and chronic white matter disease.   Electronically Signed   By: Natasha Mead M.D.   On: 01/02/2014 13:36   Dg Chest Port 1 View  01/19/2014   CLINICAL DATA:  Altered mental status  EXAM: PORTABLE CHEST - 1 VIEW  COMPARISON:  DG CHEST 2 VIEW dated 01/12/2014; DG THORACIC SPINE dated 04/21/2012; DG CHEST 2 VIEW dated 09/11/2009  FINDINGS: Stable mild cardiac enlargement. Vascular pattern is normal. No evidence of consolidation, with limited evaluation of the retrocardiac area on this single frontal view of the chest. Mild to moderate left and moderate to severe  right glenohumeral degenerative change noted.  IMPRESSION: Stable mild cardiac enlargement.  No acute findings.   Electronically Signed   By: Edgar Frisk.D.  On: 12/25/2013 12:26     EKG Interpretation   Date/Time:  Saturday January 21 2014 14:11:06 EST Ventricular Rate:  79 PR Interval:  179 QRS Duration: 90 QT Interval:  408 QTC Calculation: 468 R Axis:   69 Text Interpretation:  Sinus rhythm Nonspecific T abnormalities, anterior  leads Baseline wander in lead(s) V4 Confirmed by Shakinah Navis  MD, Enio Hornback  ([id number]) on 01/18/2014 2:33:00 PM      MDM   Final diagnoses:  Hypernatremia  Acute encephalopathy  Acute renal failure  Hypoxia    Patient is alert but does not follow commands. She's agitated. She was given a small dose of Haldol IV and patient calm down. She was able to wean oxygen down to a nasal cannula. She was previously on her breather because she's getting an EMS. She was hypoxic to 85% and has rhonchi diffusely. Given her cough and productive sputum is most likely pneumonia. X-ray negative likely due to dehydration, which is reflected in the labs with a creatinine of 5-1/2. Her hypernatremia is also related to severe dehydration. Discussed with the hospitalist, who will admit to step down. Will start on D5 half-normal saline at their request.    Audree Camel, MD 01/17/2014 1500

## 2014-01-21 NOTE — Progress Notes (Signed)
ANTIBIOTIC CONSULT NOTE - INITIAL  Pharmacy Consult for Cefepime, Vancomycin Indication: r/o HCAP  No Known Allergies  Patient Measurements: Height: 5' 1.81" (157 cm) Weight: 180 lb (81.647 kg) IBW/kg (Calculated) : 49.67   Vital Signs: Temp: 99.8 F (37.7 C) (02/28 1213) Temp src: Rectal (02/28 1213) BP: 131/32 mmHg (02/28 1409) Pulse Rate: 77 (02/28 1409)  Labs:  Recent Labs  01/17/2014 1210  WBC 23.3*  HGB 10.3*  PLT 443*  CREATININE 5.47*   Estimated Creatinine Clearance: 6.2 ml/min (by C-G formula based on Cr of 5.47). No results found for this basename: Rolm GalaVANCOTROUGH, VANCOPEAK, VANCORANDOM, GENTTROUGH, GENTPEAK, GENTRANDOM, TOBRATROUGH, TOBRAPEAK, TOBRARND, AMIKACINPEAK, AMIKACINTROU, AMIKACIN,  in the last 72 hours    Assessment: [age] yoF hospitalized 2/19 - 2/23 for acute encephalopathy and a/c renal failure. [facility], refusal of medications, not drinking and eating along with productive cough with SOB x 3d . Pt with leukocytosis, CXR in the ED with no acute findings but MD suspect this is 2/2 dehydration and given clinical picture of PNA, MD ordered Vancomycin and Cefepime for r/o HCAP. Patient found with Scr 5.47, received Vancomycin 1500 mg IV x 1 and Cefepime 1g IV x 1 in the ED.   2/28 >> Vanc >> 2/28 >> Cefepime >>   Tmax: afeb WBCs: 23.3K Renal: Scr 5.47, CG 6, N 7  2/28 blood x 2 >> in process 2/28 urine >> in process 2/28 influenza pcr >> ordered   Goal of Therapy:  Vancomycin trough level 15-20 mcg/ml  Plan:   Patient received Vancomycin 1g IV x 1 at 1448 in the ED, will continue with Vancomycin 500 mg IV q24h, next dose due 2/29 @ 1800  Vancomycin 1500 mg IV x 1 ordered, not yet given. Ok to give, then pharmacy will f/u in AM with Scr and will determine if level needed prior to re-dosing  Geoffry Paradisehuyvan Juanelle Trueheart, PharmD, BCPS Pager: [phone] 3:36 PM Pharmacy #: [id number]

## 2014-01-21 NOTE — ED Notes (Signed)
Per EMS[facility]  Pt's daughter reports AMS, pt is refusing her meds, and is not drinking and eating.  Daughter reports that pt's norm is alert and oriented.

## 2014-01-21 NOTE — ED Notes (Signed)
Bed: [id number] Expected date: 01/27/14 Expected time: 11:33 AM Means of arrival:  Comments: Renal failure

## 2014-01-22 DIAGNOSIS — N179 Acute kidney failure, unspecified: Principal | ICD-10-CM

## 2014-01-22 DIAGNOSIS — F0391 Unspecified dementia with behavioral disturbance: Secondary | ICD-10-CM

## 2014-01-22 DIAGNOSIS — E870 Hyperosmolality and hypernatremia: Secondary | ICD-10-CM

## 2014-01-22 DIAGNOSIS — F29 Unspecified psychosis not due to a substance or known physiological condition: Secondary | ICD-10-CM

## 2014-01-22 DIAGNOSIS — G9340 Encephalopathy, unspecified: Secondary | ICD-10-CM

## 2014-01-22 DIAGNOSIS — J9600 Acute respiratory failure, unspecified whether with hypoxia or hypercapnia: Secondary | ICD-10-CM

## 2014-01-22 DIAGNOSIS — F03918 Unspecified dementia, unspecified severity, with other behavioral disturbance: Secondary | ICD-10-CM

## 2014-01-22 DIAGNOSIS — N189 Chronic kidney disease, unspecified: Secondary | ICD-10-CM

## 2014-01-22 LAB — COMPREHENSIVE METABOLIC PANEL
ALK PHOS: 51 U/L (ref 39–117)
ALT: 59 U/L — ABNORMAL HIGH (ref 0–35)
AST: 66 U/L — ABNORMAL HIGH (ref 0–37)
Albumin: 2.7 g/dL — ABNORMAL LOW (ref 3.5–5.2)
BILIRUBIN TOTAL: 0.5 mg/dL (ref 0.3–1.2)
BUN: 97 mg/dL — AB (ref 6–23)
CHLORIDE: 122 meq/L — AB (ref 96–112)
CO2: 14 mEq/L — ABNORMAL LOW (ref 19–32)
CREATININE: 6.55 mg/dL — AB (ref 0.50–1.10)
Calcium: 8.5 mg/dL (ref 8.4–10.5)
GFR calc Af Amer: 6 mL/min — ABNORMAL LOW (ref >90)
GFR, EST NON AFRICAN AMERICAN: 5 mL/min — AB (ref >90)
Glucose, Bld: 236 mg/dL — ABNORMAL HIGH (ref 70–99)
POTASSIUM: 5.3 meq/L (ref 3.7–5.3)
Sodium: 159 mEq/L — ABNORMAL HIGH (ref 137–147)
Total Protein: 6.2 g/dL (ref 6.0–8.3)

## 2014-01-22 LAB — CBC
HEMATOCRIT: 31.7 % — AB (ref 36.0–46.0)
Hemoglobin: 9.3 g/dL — ABNORMAL LOW (ref 12.0–15.0)
MCH: 24 pg — ABNORMAL LOW (ref 26.0–34.0)
MCHC: 29.3 g/dL — ABNORMAL LOW (ref 30.0–36.0)
MCV: 81.9 fL (ref 78.0–100.0)
Platelets: ADEQUATE 10*3/uL (ref 150–400)
RBC: 3.87 MIL/uL (ref 3.87–5.11)
RDW: 16.4 % — ABNORMAL HIGH (ref 11.5–15.5)
WBC: 16.8 10*3/uL — AB (ref 4.0–10.5)

## 2014-01-22 MED ORDER — SODIUM CHLORIDE 0.9 % IV SOLN
0.2500 mg/h | INTRAVENOUS | Status: DC
  Administered 2014-01-22: 0.25 mg/h via INTRAVENOUS
  Administered 2014-01-23: 0.5 mg/h via INTRAVENOUS
  Filled 2014-01-22 (×2): qty 2.5

## 2014-01-22 MED ORDER — SCOPOLAMINE 1 MG/3DAYS TD PT72
1.0000 | MEDICATED_PATCH | TRANSDERMAL | Status: DC
  Administered 2014-01-22: 1.5 mg via TRANSDERMAL
  Filled 2014-01-22: qty 1

## 2014-01-22 MED ORDER — HYDROMORPHONE BOLUS VIA INFUSION
0.5000 mg | INTRAVENOUS | Status: DC | PRN
  Filled 2014-01-22: qty 1

## 2014-01-22 MED ORDER — MORPHINE SULFATE 2 MG/ML IJ SOLN
2.0000 mg | INTRAMUSCULAR | Status: DC | PRN
  Administered 2014-01-22: 2 mg via INTRAVENOUS
  Filled 2014-01-22: qty 1

## 2014-01-22 MED ORDER — ATROPINE SULFATE 1 % OP SOLN
2.0000 [drp] | Freq: Four times a day (QID) | OPHTHALMIC | Status: DC
  Administered 2014-01-22 – 2014-01-23 (×7): 2 [drp] via SUBLINGUAL
  Filled 2014-01-22: qty 2

## 2014-01-22 NOTE — Progress Notes (Signed)
[facility] PROGRESS NOTE  [name]:[id number] DOB: 1917/02/20 DOA: 2014-05-14 PCP: Marletta LorBarr, Julie, NP  Assessment/Plan:  Altered mental status  Multifactorial, patient has underlying dementia, was seen by psych in previous admission for psychosis. Now she has worsening renal failure, and probably her mental status has worsened. We'll treat the renal failure and hopefully her mental status will improve and back to baseline. Patient also has elevated white count and has been having shortness of breath over past 3 days. Chest x-ray does not show pneumonia but will continue vancomycin and cefepime empirically at this time for possible pneumonia. UA has been clear.   Acute kidney injury  Patient has worsening of creatinine from5.47- 6.55 , BUN is 97 she is on D5 half normal saline as she also has severe hypernatremia with sodium of 163. She was seen by nephrology, who recommended comfort measures.she is not a dialysis candidate per nephrology.  Hypernatremia  Sodium is 159 today,  will start D5 half normal saline at 100 per hour.  Follow sodium and the morning   Hypertension  Continue Catapres patch  Blood pressure is stable     Code Status: DNR  Family Communication: Discussed with patient's neighbors who are caregivers, Also called the niece [name] [phone], and discussed the current medical situation and poor prognoses. She agrees to make her DNR, also discussed the nephrologist recommendations.   Disposition Plan: TBD   Consultants:  Nephrology  Procedures:  None  Antibiotics:  Cefepime 2/28--  Vancomycin 2/28--  HPI/Subjective: Patient obtunded this morning, barely opens eyes to sternal rub. Nephrology has seen the patient.  Objective: Filed Vitals:   01/22/14 0800  BP:   Pulse:   Temp: 97.9 F (36.6 C)  Resp:     Intake/Output Summary (Last 24 hours) at 01/22/14 0936 Last data filed at 01/22/14 0600  Gross per 24 hour  Intake  1833.33 ml  Output     30 ml  Net 1803.33 ml   Filed Weights   Oct 26, 2014 1403 01/22/14 0000  Weight: 81.647 kg (180 lb) 77.9 kg (171 lb 11.8 oz)    Exam:   General:  Appears lethargic  Cardiovascular: S1S2 RRR  Respiratory: Clear bilaterally  Abdomen: Soft, nontender  Musculoskeletal: No edema  Data Reviewed: Basic Metabolic Panel:  Recent Labs Lab Oct 26, 2014 1210 01/22/14 0331  NA 163* 159*  K 5.0 5.3  CL 123* 122*  CO2 18* 14*  GLUCOSE 131* 236*  BUN 84* 97*  CREATININE 5.47* 6.55*  CALCIUM 9.1 8.5   Liver Function Tests:  Recent Labs Lab Oct 26, 2014 1210 01/22/14 0331  AST 122* 66*  ALT 76* 59*  ALKPHOS 64 51  BILITOT 0.5 0.5  PROT 6.9 6.2  ALBUMIN 3.3* 2.7*   No results found for this basename: LIPASE, AMYLASE,  in the last 168 hours No results found for this basename: AMMONIA,  in the last 168 hours CBC:  Recent Labs Lab Oct 26, 2014 1210 01/22/14 0331  WBC 23.3* 16.8*  NEUTROABS 20.8*  --   HGB 10.3* 9.3*  HCT 32.3* 31.7*  MCV 82.4 81.9  PLT 443* PLATELET CLUMPS NOTED ON SMEAR, COUNT APPEARS ADEQUATE   Cardiac Enzymes: No results found for this basename: CKTOTAL, CKMB, CKMBINDEX, TROPONINI,  in the last 168 hours BNP (last 3 results) No results found for this basename: PROBNP,  in the last 8760 hours CBG: No results found for this basename: GLUCAP,  in the last 168 hours  Recent Results (from the past 240 hour(s))  URINE  CULTURE     Status: None   Collection Time    01/12/14  2:50 PM      Result Value Ref Range Status   Specimen Description URINE, CLEAN CATCH   Final   Special Requests NONE   Final   Culture  Setup Time     Final   Value: 01/12/2014 22:40     Performed at [facility] Count     Final   Value: NO GROWTH     Performed at [facility]   Culture     Final   Value: NO GROWTH     Performed at [facility]   Report Status 01/13/2014 FINAL   Final  MRSA PCR SCREENING     Status: None    Collection Time    01/13/2014  4:25 PM      Result Value Ref Range Status   MRSA by PCR NEGATIVE  NEGATIVE Final   Comment:            The GeneXpert MRSA Assay (FDA     approved for NASAL specimens     only), is one component of a     comprehensive MRSA colonization     surveillance program. It is not     intended to diagnose MRSA     infection nor to guide or     monitor treatment for     MRSA infections.     Studies: Ct Head Wo Contrast  01/09/2014   CLINICAL DATA:  Change in behavior  EXAM: CT HEAD WITHOUT CONTRAST  TECHNIQUE: Contiguous axial images were obtained from the base of the skull through the vertex without intravenous contrast.  COMPARISON:  01/12/2014  FINDINGS: No skull fracture is noted. Paranasal sinuses and mastoid air cells are unremarkable. No intracranial hemorrhage, mass effect or midline shift.  No acute cortical infarction. Stable cerebral atrophy. Stable periventricular and patchy subcortical chronic white matter decreased attenuation consistent with chronic small vessel ischemic changes. No mass lesion is noted on this unenhanced scan.  IMPRESSION: No acute intracranial abnormality. Stable atrophy and chronic white matter disease.   Electronically Signed   By: Natasha Mead M.D.   On: 01/20/2014 13:36   Dg Chest Port 1 View  01/05/2014   CLINICAL DATA:  Altered mental status  EXAM: PORTABLE CHEST - 1 VIEW  COMPARISON:  DG CHEST 2 VIEW dated 01/12/2014; DG THORACIC SPINE dated 04/21/2012; DG CHEST 2 VIEW dated 09/11/2009  FINDINGS: Stable mild cardiac enlargement. Vascular pattern is normal. No evidence of consolidation, with limited evaluation of the retrocardiac area on this single frontal view of the chest. Mild to moderate left and moderate to severe right glenohumeral degenerative change noted.  IMPRESSION: Stable mild cardiac enlargement.  No acute findings.   Electronically Signed   By: Esperanza Heir M.D.   On: 12/26/2013 12:26    Scheduled Meds: . aspirin EC  81  mg Oral Daily  . ceFEPime (MAXIPIME) IV  500 g Intravenous Q24H  . [START ON 01/25/2014] cloNIDine  0.1 mg Transdermal Weekly  . heparin  5,000 Units Subcutaneous 3 times per day  . OLANZapine  5 mg Oral QHS   Continuous Infusions: . dextrose 5 % and 0.45% NaCl 100 mL/hr at 01/22/14 1610    Principal Problem:   AKI (acute kidney injury) Active Problems:   HTN (hypertension)   Acute encephalopathy   Dementia with behavioral disturbance   Hypernatremia    Time spent:  25 min   [facility]  [facility] Pager [phone]. If 7PM-7AM, please contact night-coverage at [email or web address], password [facility] 01/22/2014, 9:36 AM  LOS: 1 day

## 2014-01-22 NOTE — Consult Note (Signed)
Palliative Medicine Team at [facility]  Date: 01/22/2014   Patient Name: [name]  DOB: July 19, 1917  MRN: [id number]  Age / Sex: 80 y.o., female   PCP: Alvester Chou, NP Referring Physician: Oswald Hillock, MD  HPI/Reason for Consultation: 80 yo with progressive dementia was admitted with renal failure, dehydration, encephalopathy after sustaining a fall at home. She has become increasingly more confused and is unresponsive and unable to protect her airway-PMT consulted for goals of care.  Participants in Discussion: I met with [name]'s caregivers and had her two neices on speaker phone. I updated everyone on her serious condition. [name] is approaching EOL. Goals are comfort and dignity.  I Goals/Summary of Discussion: I anticipate a hospital death. She is extremely congested-worsening upper airway congestion. She looks moderately distressed.  Stop her fluids-congestion is markedly worse  Low dose dilaudid infusion for her dyspnea  Scop patch for upper airway secretions/atropin drops  Comfort Measures  1. Code Status:  DNR  2. Scope of Treatment:  Comfort Measures only  3. Assessment/Plan:  1. Acute Respiratory Failure, secondary to inability to protect her airway, severe dysphagia  2. Dysphagia  3. Encephaolopathy, multifactorial, likely due to renal failure and dementia  4. Agitation  5. Fall at home due to weakness and debility  Prognosis 24-48 hours.   Started on hydromorphone infusion  at 0.25, will increase to 0.$RemoveBefor'5mg'IwrpkcDIioRj$  and titrate for comfort. Bolus dosing for distress.  O2 at 1-2L, may remove if not contributing to comfort  Atropine and scop patch for secretions  ROS:  Unable to obtain Social History:   reports that she quit smoking about 44 years ago. Her smoking use included Cigarettes. She smoked 0.00 packs per day. She has never used smokeless tobacco. She reports that she does not drink alcohol or use illicit drugs. Living Situation: home with  caregivers Occupation: custodial, school system  Family History: Family History  Problem Relation Age of Onset  . Uterine cancer Sister     Active Medications:  Outpatient medications: Prescriptions prior to admission  Medication Sig Dispense Refill  . aspirin EC 81 MG tablet Take 81 mg by mouth daily.      . cloNIDine (CATAPRES - DOSED IN MG/24 HR) 0.1 mg/24hr patch Place 1 patch (0.1 mg total) onto the skin once a week.  4 patch  12  . ferrous sulfate 325 (65 FE) MG tablet Take 1 tablet (325 mg total) by mouth 2 (two) times daily with a meal.  60 tablet  3  . hydrALAZINE (APRESOLINE) 25 MG tablet Take 1 tablet (25 mg total) by mouth 3 (three) times daily.  90 tablet  3  . senna (SENOKOT) 8.6 MG TABS tablet Take 1 tablet by mouth daily.      [name] OLANZapine (ZYPREXA) 5 MG tablet Take 1 tablet (5 mg total) by mouth at bedtime.  30 tablet  1    Current medications: Infusions: . HYDROmorphone      Scheduled Medications: . aspirin EC  81 mg Oral Daily  . atropine  2 drop Sublingual QID  . [START ON 01/25/2014] cloNIDine  0.1 mg Transdermal Weekly  . scopolamine  1 patch Transdermal Q72H    PRN Medications: HYDROmorphone, LORazepam, ondansetron (ZOFRAN) IV   Vital Signs: BP 127/31  Pulse 65  Temp(Src) 98.3 F (36.8 C) (Axillary)  Resp 15  Ht $R'5\' 1"'Rd$  (1.549 m)  Wt 77.9 kg (171 lb 11.8 oz)  BMI 32.47 kg/m2  SpO2 95%   Physical Exam:  Unresponsive but eyes open, copious oral secretions Chest with Rhonchi, Tachycardia Abdomen soft, not distended Extremities with edema.  Labs:  Basic or Comprehensive Metabolic Panel:    Component Value Date/Time   NA 159* 01/22/2014 0331   K 5.3 01/22/2014 0331   CL 122* 01/22/2014 0331   CO2 14* 01/22/2014 0331   BUN 97* 01/22/2014 0331   CREATININE 6.55* 01/22/2014 0331   GLUCOSE 236* 01/22/2014 0331   CALCIUM 8.5 01/22/2014 0331   AST 66* 01/22/2014 0331   ALT 59* 01/22/2014 0331   ALKPHOS 51 01/22/2014 0331   BILITOT 0.5 01/22/2014 0331   PROT 6.2  01/22/2014 0331   ALBUMIN 2.7* 01/22/2014 0331     CBC:    Component Value Date/Time   WBC 16.8* 01/22/2014 0331   HGB 9.3* 01/22/2014 0331   HCT 31.7* 01/22/2014 0331   PLT PLATELET CLUMPS NOTED ON SMEAR, COUNT APPEARS ADEQUATE 01/22/2014 0331   MCV 81.9 01/22/2014 0331   NEUTROABS 20.8* 01/07/2014 1210   LYMPHSABS 1.2 01/08/2014 1210   MONOABS 1.3* 12/30/2013 1210   EOSABS 0.0 01/20/2014 1210   BASOSABS 0.0 01/14/2014 1210     BNP (last 3 results) No results found for this basename: PROBNP,  in the last 8760 hours  CBG (last 3)  No results found for this basename: GLUCAP,  in the last 72 hours  Imaging:  Ct Head Wo Contrast  12/30/2013   CLINICAL DATA:  Change in behavior  EXAM: CT HEAD WITHOUT CONTRAST  TECHNIQUE: Contiguous axial images were obtained from the base of the skull through the vertex without intravenous contrast.  COMPARISON:  01/12/2014  FINDINGS: No skull fracture is noted. Paranasal sinuses and mastoid air cells are unremarkable. No intracranial hemorrhage, mass effect or midline shift.  No acute cortical infarction. Stable cerebral atrophy. Stable periventricular and patchy subcortical chronic white matter decreased attenuation consistent with chronic small vessel ischemic changes. No mass lesion is noted on this unenhanced scan.  IMPRESSION: No acute intracranial abnormality. Stable atrophy and chronic white matter disease.   Electronically Signed   By: Lahoma Crocker M.D.   On: 01/12/2014 13:36   Dg Chest Port 1 View  12/31/2013   CLINICAL DATA:  Altered mental status  EXAM: PORTABLE CHEST - 1 VIEW  COMPARISON:  DG CHEST 2 VIEW dated 01/12/2014; DG THORACIC SPINE dated 04/21/2012; DG CHEST 2 VIEW dated 09/11/2009  FINDINGS: Stable mild cardiac enlargement. Vascular pattern is normal. No evidence of consolidation, with limited evaluation of the retrocardiac area on this single frontal view of the chest. Mild to moderate left and moderate to severe right glenohumeral degenerative change  noted.  IMPRESSION: Stable mild cardiac enlargement.  No acute findings.   Electronically Signed   By: Skipper Cliche M.D.   On: 01/12/2014 12:26    Other Data:  (2D echo, EKG...)   Time: 230-310PM- 40 minutes Greater than 50%  of this time was spent counseling and coordinating care related to the above assessment and plan.  Signed by: Acquanetta Chain, DO  01/22/2014, 3:08 PM  Please contact Palliative Medicine Team phone at [phone] for questions and concerns.

## 2014-01-22 NOTE — Consult Note (Signed)
Palliative Medicine Team Consult Note  I met with Maryelizabeth's caregivers and had her two neices on speaker phone. I updated everyone on her serious condition. Maureena is approaching EOL. Goals are comfort and dignity.  I anticipate a hospital death. She is extremely congested-worsening upper airway congestion. She looks moderately distressed.   Stop her fluids-congestion is markedly worse  Low dose dilaudid infusion for her dyspnea  Scop patch for upper airway secretions/atropin drops  Comfort Measures  Lane Hacker, DO Palliative Medicine  Full note to follow.

## 2014-01-22 DEATH — deceased

## 2014-01-23 ENCOUNTER — Encounter (HOSPITAL_COMMUNITY): Payer: Self-pay

## 2014-01-23 LAB — URINE CULTURE
COLONY COUNT: NO GROWTH
CULTURE: NO GROWTH

## 2014-01-24 NOTE — Progress Notes (Signed)
Shift event: Pt expired at 2327 02/15/2014. Pronounced by 2 RNs. Pt was comfort care and death was expected. No family at hospital. Death certificate completed and given to RN.  Jimmye NormanKaren Kirby-Graham, NP [facility]

## 2014-01-24 NOTE — Care Management Note (Signed)
Cm received telephone call from patient's caregiver Lonie PeakGrace Carmen (home [phone]/ cell [phone]) who states herself and another neighbor Gardiner RamusLillian has POA to make funeral arrangements and was calling concerning the release of the patient's body. Cm informed the Administrative Coordinator of the problem whom contacted Everrett CoombeKim Matthews, supervisor of Patient Placement. Everrett CoombeKim Matthews to provided with contact information for Lonie PeakGrace Carmen to inform individual of appropriate actions to take.   Roxy Mannsymeeka Isham Smitherman,MSN,RN [phone]

## 2014-01-24 NOTE — Progress Notes (Signed)
This RN wasted 50 ml of dilaudid 0.5mg /ml IV drip with Hilton CorkEkua RN

## 2014-01-27 LAB — CULTURE, BLOOD (ROUTINE X 2)
Culture: NO GROWTH
Culture: NO GROWTH

## 2014-02-07 NOTE — Discharge Summary (Signed)
Death Summary  Joy BlowGertrude [name] NFA:[id number] DOB: Apr 01, 1917 DOA: Aug 31, 2014  PCP: Marletta LorBarr, Julie, NP PCP/Office notified:   Admit date: Aug 31, 2014 Date of Death: 02/07/2014  Final Diagnoses:  Principal Problem:   AKI (acute kidney injury) Active Problems:   HTN (hypertension)   Acute encephalopathy   Dementia with behavioral disturbance   Hypernatremia    1.   History of present illness:  80 year old female who was discharged [facility] 2/[facility] after she was treated for altered mental status and acute renal failure. Patient was constantly refusing the treatments while she was the hospital. As per patient's neighbors, who are her caregivers patient was fine on the day of discharge, but then started to go downhill as she was not eating and drinking over the past few days and was very lethargic. Patient's care have been somewhat limited due to constant refusal for treatments as well as agitation. She also has been having cough with shortness of breath over the past 3 days.  In the ED patient was found to be in acute kidney injury with worsening creatinine today of 5.47. Her sodium is 163, BUN 84 with CO2 of 18.  Patient also has elevated white count of 23,000. And she has been started on vancomycin and cefepime empirically for healthcare associated pneumonia. Patient received Haldol in the ED as she was agitated and is unable to provide any meaningful history.   Hospital Course:  Altered mental status  Patient had underlying dementia, she was seen by psychiatry increased admission for psychosis and this time was admitted with worsening renal failure which worsened mental status. Chest x-ray did not show pneumonia. Patient was started obliquely on vancomycin and cefepime. Patient's mental status did not improve, palliative care was consulted and patient was made total comfort care.  Acute kidney injury  Patient has worsening of creatinine from5.47- 6.55 , BUN is 97 she was started  on D5 half normal saline She was seen by nephrology, who recommended comfort measures.she is not a dialysis candidate per nephrology.    Comfort care  After the discussion with palliative care, patient was started on low-dose Dilaudid drip and expired on 02/15/2014        Time: 35 minutes  Signed:  LAMA,GAGAN S  [facility] 02/07/2014, 7:07 PM

## 2014-02-07 NOTE — H&P (Signed)
PCP:  Alvester Chou, NP  Chief Complaint:  Altered mental status  HPI:  80 year old female who was [facility] 2/[facility] after she was treated for altered mental status and acute renal failure. Patient was constantly refusing the treatments while she was the hospital. As per patient's neighbors, who are her caregivers patient was fine on the day of discharge, but then started to go downhill as she was not eating and drinking over the past few days and was very lethargic. Patient's care have been somewhat limited due to constant refusal for treatments as well as agitation. She also has been having cough with shortness of breath over the past 3 days.  In the ED patient was found to be in acute kidney injury with worsening creatinine today of 5.47. Her sodium is 163, BUN 84 with CO2 of 18.  Patient also has elevated white count of 23,000. And she has been started on vancomycin and cefepime empirically for healthcare associated pneumonia. Patient received Haldol in the ED as she was agitated and is unable to provide any meaningful history.  Allergies:  No Known Allergies  Past Medical History   Diagnosis  Date   .  Heart murmur    .  Hypertension    .  Dizziness  04/2012   .  Fall    .  Dementia     Past Surgical History   Procedure  Laterality  Date   .  Abdominal hysterectomy     .  Leg surgery       pt states has a rod in right leg    Prior to Admission medications   Medication  Sig  Start Date  End Date  Taking?  Authorizing Provider   aspirin EC 81 MG tablet  Take 81 mg by mouth daily.    Yes  Historical Provider, MD   cloNIDine (CATAPRES - DOSED IN MG/24 HR) 0.1 mg/24hr patch  Place 1 patch (0.1 mg total) onto the skin once a week.  01/16/14   Yes  Theodis Blaze, MD   ferrous sulfate 325 (65 FE) MG tablet  Take 1 tablet (325 mg total) by mouth 2 (two) times daily with a meal.  01/16/14   Yes  Theodis Blaze, MD   hydrALAZINE (APRESOLINE) 25 MG tablet  Take 1 tablet (25  mg total) by mouth 3 (three) times daily.  01/16/14   Yes  Theodis Blaze, MD   senna (SENOKOT) 8.6 MG TABS tablet  Take 1 tablet by mouth daily.    Yes  Historical Provider, MD   OLANZapine (ZYPREXA) 5 MG tablet  Take 1 tablet (5 mg total) by mouth at bedtime.  01/16/14    Theodis Blaze, MD   Social History:  reports that she quit smoking about 44 years ago. Her smoking use included Cigarettes. She smoked 0.00 packs per day. She has never used smokeless tobacco. She reports that she does not drink alcohol or use illicit drugs.  Family History   Problem  Relation  Age of Onset   .  Uterine cancer  Sister     All the positives are listed in BOLD  Review of Systems:  I would to obtain due to patient's altered mental status  Physical Exam:  Blood pressure 131/32, pulse 77, temperature 99.8 F (37.7 C), temperature source Rectal, resp. rate 23, weight 81.647 kg (180 lb), SpO2 100.00%.  Constitutional: Patient appears agitated though she is somnolent.  Head: Normocephalic  and atraumatic  Mouth: Mucus membranes moist  Eyes: PERRL, EOMI, conjunctivae normal  Neck: Supple, No Thyromegaly  Cardiovascular: RRR, S1 normal, S2 normal  Pulmonary/Chest: CTAB, no wheezes, rales, or rhonchi  Abdominal: Soft. Non-tender, non-distended, bowel sounds are normal, no masses, organomegaly, or guarding present.  Neurological: Somnolent, moving all extremities.  Extremities : No Cyanosis, Clubbing or Edema  Labs on Admission:  Results for orders placed during the hospital encounter of 01/15/2014 (from the past 48 hour(s))   URINALYSIS, ROUTINE W REFLEX MICROSCOPIC Status: Abnormal    Collection Time    12/26/2013 12:08 PM   Result  Value  Ref Range    Color, Urine  YELLOW  YELLOW    APPearance  CLOUDY (*)  CLEAR    Specific Gravity, Urine  1.016  1.005 - 1.030    pH  5.5  5.0 - 8.0    Glucose, UA  NEGATIVE  NEGATIVE mg/dL    Hgb urine dipstick  MODERATE (*)  NEGATIVE    Bilirubin Urine  SMALL (*)  NEGATIVE     Ketones, ur  NEGATIVE  NEGATIVE mg/dL    Protein, ur  >300 (*)  NEGATIVE mg/dL    Urobilinogen, UA  0.2  0.0 - 1.0 mg/dL    Nitrite  NEGATIVE  NEGATIVE    Leukocytes, UA  NEGATIVE  NEGATIVE   URINE MICROSCOPIC-ADD ON Status: Abnormal    Collection Time    01/09/2014 12:08 PM   Result  Value  Ref Range    WBC, UA  0-2  <3 WBC/hpf    RBC / HPF  3-6  <3 RBC/hpf    Bacteria, UA  FEW (*)  RARE    Casts  HYALINE CASTS (*)  NEGATIVE    Comment:  GRANULAR CAST   CBC WITH DIFFERENTIAL Status: Abnormal    Collection Time    01/05/2014 12:10 PM   Result  Value  Ref Range    WBC  23.3 (*)  4.0 - 10.5 K/uL    RBC  3.92  3.87 - 5.11 MIL/uL    Hemoglobin  10.3 (*)  12.0 - 15.0 g/dL    HCT  32.3 (*)  36.0 - 46.0 %    MCV  82.4  78.0 - 100.0 fL    MCH  26.3  26.0 - 34.0 pg    MCHC  31.9  30.0 - 36.0 g/dL    RDW  16.2 (*)  11.5 - 15.5 %    Platelets  443 (*)  150 - 400 K/uL    Neutrophils Relative %  89 (*)  43 - 77 %    Neutro Abs  20.8 (*)  1.7 - 7.7 K/uL    Lymphocytes Relative  5 (*)  12 - 46 %    Lymphs Abs  1.2  0.7 - 4.0 K/uL    Monocytes Relative  6  3 - 12 %    Monocytes Absolute  1.3 (*)  0.1 - 1.0 K/uL    Eosinophils Relative  0  0 - 5 %    Eosinophils Absolute  0.0  0.0 - 0.7 K/uL    Basophils Relative  0  0 - 1 %    Basophils Absolute  0.0  0.0 - 0.1 K/uL   COMPREHENSIVE METABOLIC PANEL Status: Abnormal    Collection Time    12/27/2013 12:10 PM   Result  Value  Ref Range    Sodium  163 (*)  137 - 147 mEq/L  Comment:  CRITICAL RESULT CALLED TO, READ BACK BY AND VERIFIED WITH:     M TEUP AT 1332 ON 02.28.2015 BY NBROOKS    Potassium  5.0  3.7 - 5.3 mEq/L    Chloride  123 (*)  96 - 112 mEq/L    CO2  18 (*)  19 - 32 mEq/L    Glucose, Bld  131 (*)  70 - 99 mg/dL    BUN  84 (*)  6 - 23 mg/dL    Creatinine, Ser  5.47 (*)  0.50 - 1.10 mg/dL    Calcium  9.1  8.4 - 10.5 mg/dL    Total Protein  6.9  6.0 - 8.3 g/dL    Albumin  3.3 (*)  3.5 - 5.2 g/dL    AST  122 (*)  0 - 37 U/L     ALT  76 (*)  0 - 35 U/L    Alkaline Phosphatase  64  39 - 117 U/L    Total Bilirubin  0.5  0.3 - 1.2 mg/dL    GFR calc non Af Amer  6 (*)  >90 mL/min    GFR calc Af Amer  7 (*)  >90 mL/min    Comment:  (NOTE)     The eGFR has been calculated using the CKD EPI equation.     This calculation has not been validated in all clinical situations.     eGFR's persistently <90 mL/min signify possible Chronic Kidney     Disease.   I-STAT CG4 LACTIC ACID, ED Status: None    Collection Time    01/11/2014 12:22 PM   Result  Value  Ref Range    Lactic Acid, Venous  1.67  0.5 - 2.2 mmol/L    Radiological Exams on Admission:  Ct Head Wo Contrast  01/20/2014 CLINICAL DATA: Change in behavior EXAM: CT HEAD WITHOUT CONTRAST TECHNIQUE: Contiguous axial images were obtained from the base of the skull through the vertex without intravenous contrast. COMPARISON: 01/12/2014 FINDINGS: No skull fracture is noted. Paranasal sinuses and mastoid air cells are unremarkable. No intracranial hemorrhage, mass effect or midline shift. No acute cortical infarction. Stable cerebral atrophy. Stable periventricular and patchy subcortical chronic white matter decreased attenuation consistent with chronic small vessel ischemic changes. No mass lesion is noted on this unenhanced scan. IMPRESSION: No acute intracranial abnormality. Stable atrophy and chronic white matter disease. Electronically Signed By: Lahoma Crocker M.D. On: 12/30/2013 13:36  Dg Chest Port 1 View  01/07/2014 CLINICAL DATA: Altered mental status EXAM: PORTABLE CHEST - 1 VIEW COMPARISON: DG CHEST 2 VIEW dated 01/12/2014; DG THORACIC SPINE dated 04/21/2012; DG CHEST 2 VIEW dated 09/11/2009 FINDINGS: Stable mild cardiac enlargement. Vascular pattern is normal. No evidence of consolidation, with limited evaluation of the retrocardiac area on this single frontal view of the chest. Mild to moderate left and moderate to severe right glenohumeral degenerative change noted. IMPRESSION:  Stable mild cardiac enlargement. No acute findings. Electronically Signed By: Skipper Cliche M.D. On: 01/12/2014 12:26   Assessment/Plan  Principal Problem:  AKI (acute kidney injury)  Active Problems:  HTN (hypertension)  Acute encephalopathy  Dementia with behavioral disturbance  Hypernatremia   Altered mental status  Multifactorial, patient has underlying dementia, was seen by psych in previous admission for psychosis. Now she has worsening renal failure, and probably her mental status has worsened. We'll treat the renal failure and hopefully her mental status will improve and back to baseline. Patient also has elevated white count and has  been having shortness of breath over past 3 days. Chest x-ray does not show pneumonia but will continue vancomycin and cefepime empirically at this time for possible pneumonia. UA has been clear.  Acute kidney injury  Patient has worsening of creatinine from 2.58 to 5.47, will start her on D5 half normal saline as she also has severe hypernatremia with sodium of 163. She will be seen by nephrology in consultation. Called and discussed with Dr. Melvia Heaps.  Hypernatremia  Sodium is 163, will start D5 half normal saline at 100 per hour.  Follow sodium and the morning  Hypertension  Continue Catapres patch  Blood pressure is stable  CODE STATUS- unclear power of attorney, discussed with patient's neighbors Bettye Boeck and Chipper Oman, at this time patient is full code.  Family discussion: Discussed with patient's neighbors who are caregivers, Also called the niece Merdis Delay [phone], and discussed the current medical situation and poor prognoses. She agrees to talk to palliative care. Will get the palliative care consult.  DSS social worker- Purcell Mouton [phone]  Time Spent on Admission:  70 minutes  [facility]  Pager: [phone]  01/02/2014, 3:16 PM  If 7PM-7AM, please contact night-coverage  [email or web address]   Password [email or web address]

## 2014-02-22 NOTE — Significant Event (Signed)
This RN checked on pt during hourly rounds. Pt was found unresponsive and without visible breathing. This RN listened to pt's chest for 1 minute, no breath sounds or heart sounds were auscultated. No radial or carotid pulse was palpated. Makayli Bracken RN verified pt's death. Craige CottaKirby, NP was notified of patients time of death and order was received for RN to pronounce death. Pt's niece, Vicente Massonatricia Christopher was notified. Pt's landlady, Delorise ShinerGrace, was notified. She stated that she would need some time to figure out the funeral arrangements and asked how long the pt can remain her. This RN stated that the pt will be transported to the morgue and that the funeral home will be able to pick up the body.

## 2014-02-22 NOTE — Progress Notes (Signed)
Nutrition Brief Note  Patient screened for MST score Chart reviewed. Pt now transitioning to comfort care.  No further nutrition interventions warranted at this time.  Please re-consult as needed.    Lloyd HugerSarah F Curley Hogen MS RD LDN Clinical Dietitian Pager:[phone]

## 2014-02-22 NOTE — Care Management Note (Addendum)
Cm consulted Palliative Team concerning GIP appropriateness for pt. Per MD Phillips OdorGolding, Pt actively dying, prognosis questionable less than 48 hrs.Cm spoke with patient's niece Camillia Herteratricia Christian concerning [facility]. Cm informed niece of GIP referral process and informed niece of choice of Hospice agencies provided if pt assessed as appropriate.MD to reassess for GIP in the am. Per pt's niece if patient appropriate in am [facility] to provide GIP services with Dr. Phillips OdorGolding as attending. Per pt's niece, pt's landlord Neena RhymesLillian Grace has POA to arrange pt's funeral arrangements only. This information was verified with Neena RhymesLillian Grace by CM at [phone].    Roxy Mannsymeeka Adonias Demore,MSN,RN [phone]

## 2014-02-22 NOTE — Progress Notes (Addendum)
[facility] PROGRESS NOTE  Joy BlowGertrude [name] WUJ:[id number]:[id number] DOB: [birth year] DOA: 01/06/2014 PCP: Marletta LorBarr, Julie, NP  Assessment/Plan:  Altered mental status  Multifactorial, patient has underlying dementia, was seen by psych in previous admission for psychosis. Now she has worsening renal failure, and probably her mental status has worsened. We'll treat the renal failure and hopefully her mental status will improve and back to baseline. Patient also has elevated white count and has been having shortness of breath over past 3 days. Chest x-ray does not show pneumonia but will continue vancomycin and cefepime empirically at this time for possible pneumonia. UA has been clear.   Acute kidney injury  Patient has worsening of creatinine from5.47- 6.55 , BUN is 97 she is on D5 half normal saline as she also has severe hypernatremia with sodium of 163. She was seen by nephrology, who recommended comfort measures.she is not a dialysis candidate per nephrology.   Hypertension  Continue Catapres patch  Blood pressure is stable   Comfort care Patient has been made full comfort care, IV fluids discontinued and started on low dose Dilaudid drip. Palliative care is following.  Code Status: DNR  Family Communication: Discussed with patient's neighbors who are caregivers, Also called the niece Camillia Herteratricia Christian [phone], and discussed the current medical situation and poor prognoses. She agrees to make her DNR, also discussed the nephrologist recommendations.   Disposition Plan: TBD   Consultants:  Nephrology  Procedures:  None  Antibiotics:  Cefepime 2/28--  Vancomycin 2/28--  HPI/Subjective: Patient obtunded this morning, barely opens eyes to sternal rub.   Objective: Filed Vitals:   02/19/2014 0522  BP: 87/31  Pulse: 78  Temp: 98 F (36.7 C)  Resp: 16    Intake/Output Summary (Last 24 hours) at 02/15/2014 0957 Last data filed at 01/22/14 1656  Gross per 24 hour  Intake     500 ml  Output    100 ml  Net    400 ml   Filed Weights   12/30/2013 1403 01/22/14 0000  Weight: 81.647 kg (180 lb) 77.9 kg (171 lb 11.8 oz)    Exam:   General:  Appears lethargic  Cardiovascular: S1S2 RRR  Respiratory: Clear bilaterally  Abdomen: Soft, nontender  Musculoskeletal: No edema  Data Reviewed: Basic Metabolic Panel:  Recent Labs Lab 01/06/2014 1210 01/22/14 0331  NA 163* 159*  K 5.0 5.3  CL 123* 122*  CO2 18* 14*  GLUCOSE 131* 236*  BUN 84* 97*  CREATININE 5.47* 6.55*  CALCIUM 9.1 8.5   Liver Function Tests:  Recent Labs Lab 01/13/2014 1210 01/22/14 0331  AST 122* 66*  ALT 76* 59*  ALKPHOS 64 51  BILITOT 0.5 0.5  PROT 6.9 6.2  ALBUMIN 3.3* 2.7*   No results found for this basename: LIPASE, AMYLASE,  in the last 168 hours No results found for this basename: AMMONIA,  in the last 168 hours CBC:  Recent Labs Lab 12/28/2013 1210 01/22/14 0331  WBC 23.3* 16.8*  NEUTROABS 20.8*  --   HGB 10.3* 9.3*  HCT 32.3* 31.7*  MCV 82.4 81.9  PLT 443* PLATELET CLUMPS NOTED ON SMEAR, COUNT APPEARS ADEQUATE   Cardiac Enzymes: No results found for this basename: CKTOTAL, CKMB, CKMBINDEX, TROPONINI,  in the last 168 hours BNP (last 3 results) No results found for this basename: PROBNP,  in the last 8760 hours CBG: No results found for this basename: GLUCAP,  in the last 168 hours  Recent Results (from the past 240 hour(s))  CULTURE, BLOOD (  ROUTINE X 2)     Status: None   Collection Time    01/01/2014 12:10 PM      Result Value Ref Range Status   Specimen Description BLOOD RIGHT ARM   Final   Special Requests BOTTLES DRAWN AEROBIC AND ANAEROBIC 5CC EACH   Final   Culture  Setup Time     Final   Value: 12/31/2013 23:31     Performed at [facility]   Culture     Final   Value:        BLOOD CULTURE RECEIVED NO GROWTH TO DATE CULTURE WILL BE HELD FOR 5 DAYS BEFORE ISSUING A FINAL NEGATIVE REPORT     Performed at [facility]   Report  Status PENDING   Incomplete  CULTURE, BLOOD (ROUTINE X 2)     Status: None   Collection Time    12/28/2013 12:15 PM      Result Value Ref Range Status   Specimen Description BLOOD RIGHT ARM   Final   Special Requests BOTTLES DRAWN AEROBIC AND ANAEROBIC 4CC EACH   Final   Culture  Setup Time     Final   Value: 01/03/2014 23:31     Performed at [facility]   Culture     Final   Value:        BLOOD CULTURE RECEIVED NO GROWTH TO DATE CULTURE WILL BE HELD FOR 5 DAYS BEFORE ISSUING A FINAL NEGATIVE REPORT     Performed at [facility]   Report Status PENDING   Incomplete  MRSA PCR SCREENING     Status: None   Collection Time    01/18/2014  4:25 PM      Result Value Ref Range Status   MRSA by PCR NEGATIVE  NEGATIVE Final   Comment:            The GeneXpert MRSA Assay (FDA     approved for NASAL specimens     only), is one component of a     comprehensive MRSA colonization     surveillance program. It is not     intended to diagnose MRSA     infection nor to guide or     monitor treatment for     MRSA infections.     Studies: Ct Head Wo Contrast  01/05/2014   CLINICAL DATA:  Change in behavior  EXAM: CT HEAD WITHOUT CONTRAST  TECHNIQUE: Contiguous axial images were obtained from the base of the skull through the vertex without intravenous contrast.  COMPARISON:  01/12/2014  FINDINGS: No skull fracture is noted. Paranasal sinuses and mastoid air cells are unremarkable. No intracranial hemorrhage, mass effect or midline shift.  No acute cortical infarction. Stable cerebral atrophy. Stable periventricular and patchy subcortical chronic white matter decreased attenuation consistent with chronic small vessel ischemic changes. No mass lesion is noted on this unenhanced scan.  IMPRESSION: No acute intracranial abnormality. Stable atrophy and chronic white matter disease.   Electronically Signed   By: Natasha Mead M.D.   On: 01/20/2014 13:36   Dg Chest Port 1 View  01/02/2014    CLINICAL DATA:  Altered mental status  EXAM: PORTABLE CHEST - 1 VIEW  COMPARISON:  DG CHEST 2 VIEW dated 01/12/2014; DG THORACIC SPINE dated 04/21/2012; DG CHEST 2 VIEW dated 09/11/2009  FINDINGS: Stable mild cardiac enlargement. Vascular pattern is normal. No evidence of consolidation, with limited evaluation of the retrocardiac area on this single frontal view of the  chest. Mild to moderate left and moderate to severe right glenohumeral degenerative change noted.  IMPRESSION: Stable mild cardiac enlargement.  No acute findings.   Electronically Signed   By: Esperanza Heir M.D.   On: 02-05-14 12:26    Scheduled Meds: . aspirin EC  81 mg Oral Daily  . atropine  2 drop Sublingual QID  . [START ON 01/25/2014] cloNIDine  0.1 mg Transdermal Weekly  . scopolamine  1 patch Transdermal Q72H   Continuous Infusions: . HYDROmorphone 0.25 mg/hr (01/22/14 1527)    Principal Problem:   AKI (acute kidney injury) Active Problems:   HTN (hypertension)   Acute encephalopathy   Dementia with behavioral disturbance   Hypernatremia    Time spent: 25 min   [facility]  [facility] Pager [phone]. If 7PM-7AM, please contact night-coverage at [email or web address], password [email or web address] 02/10/2014, 9:57 AM  LOS: 2 days

## 2014-02-22 NOTE — Progress Notes (Signed)
02/12/2014 1300  Clinical Encounter Type  Visited With Family (apparently closest local friends:  church member, landlord)  Visit Type Spiritual support;Social support  Referral From [facility]( consult)  Spiritual Encounters  Spiritual Needs Emotional;Grief support  Stress Factors  Family Stress Factors ([place], [place] (nieces[place]   Visited with pt's local supporters, landlord and church member, who are feeling stressed by pt's lack of family support and their lack of legal permission to assist with pt's financial affairs and to prepare for final arrangements.  Introduced [employer] and Financial traderchaplain availability, encouraged self-care.  Pleaes page as needs arise.  [address] Kirk[address] StreetChaplain Camala Talwar [place], [place] [phone]

## 2014-02-22 NOTE — Progress Notes (Addendum)
Palliative Care Team at [facility] Progress Note   SUBJECTIVE: Unresponsive with eyes slightly open-making vocalization with her breathing but does not appear to be in significant distress- no grimace on her brow, slightly elevated respiratory rate and abdominal breathing. Upper airway secretions audible.  OBJECTIVE: Vital Signs: BP 97/29  Pulse 80  Temp(Src) 98.5 F (36.9 C) (Axillary)  Resp 16  Ht 5\' 1"  (1.549 m)  Wt 77.9 kg (171 lb 11.8 oz)  BMI 32.47 kg/m2  SpO2 87%   Intake and Output:    Physical Exam: General: Vital signs reviewed and noted. Frail chronically ill appearing woman, mildly diaphoretic, slightly tachypnea with throat and chest congestion.  Head: Normocephalic, atraumatic.  Lungs:  Increased effort, respiratory vocalizations  Heart: IRIR. (+) murmur  Abdomen:  Slightly distended but non-tender.  Extremities: + edema in hands and LE, no mottling    No Known Allergies  Medications: Scheduled Meds:  . aspirin EC  81 mg Oral Daily  . atropine  2 drop Sublingual QID  . [START ON 01/25/2014] cloNIDine  0.1 mg Transdermal Weekly  . scopolamine  1 patch Transdermal Q72H    Continuous Infusions: . HYDROmorphone 0.5 mg/hr (01/28/2014 1922)    PRN Meds: HYDROmorphone, LORazepam, ondansetron (ZOFRAN) IV  Stool Softner: dulcolax  Palliative Performance Scale: 10%   Labs: CBC    Component Value Date/Time   WBC 16.8* 01/22/2014 0331   RBC 3.87 01/22/2014 0331   RBC 3.22* 01/12/2014 1533   HGB 9.3* 01/22/2014 0331   HCT 31.7* 01/22/2014 0331   PLT PLATELET CLUMPS NOTED ON SMEAR, COUNT APPEARS ADEQUATE 01/22/2014 0331   MCV 81.9 01/22/2014 0331   MCH 24.0* 01/22/2014 0331   MCHC 29.3* 01/22/2014 0331   RDW 16.4* 01/22/2014 0331   LYMPHSABS 1.2 12/27/2013 1210   MONOABS 1.3* 01/16/2014 1210   EOSABS 0.0 01/09/2014 1210   BASOSABS 0.0 01/15/2014 1210    CMET     Component Value Date/Time   NA 159* 01/22/2014 0331   K 5.3 01/22/2014 0331   CL 122* 01/22/2014 0331   CO2 14*  01/22/2014 0331   GLUCOSE 236* 01/22/2014 0331   BUN 97* 01/22/2014 0331   CREATININE 6.55* 01/22/2014 0331   CALCIUM 8.5 01/22/2014 0331   PROT 6.2 01/22/2014 0331   ALBUMIN 2.7* 01/22/2014 0331   AST 66* 01/22/2014 0331   ALT 59* 01/22/2014 0331   ALKPHOS 51 01/22/2014 0331   BILITOT 0.5 01/22/2014 0331   GFRNONAA 5* 01/22/2014 0331   GFRAA 6* 01/22/2014 0331    ASSESSMENT/ PLAN: 80 yo woman actively dying of end stage renal failure from long standing HTN and DM-She has had a rapid decline in the past few weeks with severe AMS-delirium with behavioral disturbance and little or no family available to assist her-caregivers and members from her church would help her at home prior to admission. She has been transition to full comfort care.  1. Acute Respiratory Failure, secondary to inability to protect her airway, severe dysphagia 2. Dysphagia 3. Encephaolopathy, multifactorial, likely due to renal failure and dementia 4. Agitation 5. Fall at home due to weakness and debility   Started on hydromorphone infusion yesterday at 0.25, will increase to 0.5mg  and titrate for comfort. Bolus dosing for distress.  O2 at 1-2L, may remove if not contributing to comfort  Atropine and scop patch for secretions  Caregivers updated by phone.  I anticipate a hospital death in the next 24-48 hours. No close family-church caregivers have checked in on  her.   25 minutes. Greater than 50%  of this time was spent counseling and coordinating care related to the above assessment and plan.   Edsel PetrinElizabeth L Fadil Macmaster, DO  02/21/2014, 12:30 AM  Please contact Palliative Medicine Team phone at [phone] for questions and concerns.

## 2014-02-22 DEATH — deceased
